# Patient Record
Sex: Male | Born: 1970 | Race: White | Hispanic: No | Smoking: Never smoker
Health system: Southern US, Community
[De-identification: ages and names within clinical notes are randomized; demographics above are authoritative.]

## PROBLEM LIST (undated history)

## (undated) DIAGNOSIS — Z8669 Personal history of other diseases of the nervous system and sense organs: Secondary | ICD-10-CM

## (undated) DIAGNOSIS — R972 Elevated prostate specific antigen [PSA]: Secondary | ICD-10-CM

## (undated) DIAGNOSIS — N529 Male erectile dysfunction, unspecified: Secondary | ICD-10-CM

## (undated) DIAGNOSIS — A630 Anogenital (venereal) warts: Secondary | ICD-10-CM

## (undated) DIAGNOSIS — Z8719 Personal history of other diseases of the digestive system: Secondary | ICD-10-CM

## (undated) HISTORY — DX: Personal history of other diseases of the nervous system and sense organs: Z86.69

## (undated) HISTORY — DX: Elevated prostate specific antigen (PSA): R97.20

## (undated) HISTORY — PX: COLONOSCOPY: SHX174

## (undated) HISTORY — PX: TONSILLECTOMY: SHX5217

## (undated) HISTORY — PX: WISDOM TOOTH EXTRACTION: SHX21

## (undated) HISTORY — DX: Personal history of other diseases of the digestive system: Z87.19

## (undated) HISTORY — DX: Male erectile dysfunction, unspecified: N52.9

## (undated) HISTORY — DX: Anogenital (venereal) warts: A63.0

---
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
## 2012-03-23 ENCOUNTER — Ambulatory Visit (INDEPENDENT_AMBULATORY_CARE_PROVIDER_SITE_OTHER): Payer: BC Managed Care – PPO | Admitting: Family Medicine

## 2012-03-23 ENCOUNTER — Encounter: Payer: Self-pay | Admitting: Family Medicine

## 2012-03-23 VITALS — BP 120/78 | HR 77 | Ht 73.0 in | Wt 192.0 lb

## 2012-03-23 DIAGNOSIS — Z7689 Persons encountering health services in other specified circumstances: Secondary | ICD-10-CM

## 2012-03-23 DIAGNOSIS — Z7189 Other specified counseling: Secondary | ICD-10-CM

## 2012-03-23 DIAGNOSIS — G43009 Migraine without aura, not intractable, without status migrainosus: Secondary | ICD-10-CM

## 2012-03-23 DIAGNOSIS — K625 Hemorrhage of anus and rectum: Secondary | ICD-10-CM

## 2012-03-23 NOTE — Assessment & Plan Note (Signed)
Reassured pt that this sounds like either hemorrhoidal or fissure bleeding, happens rarely.   Reassured pt. Signs/symptoms to call or return for were reviewed and pt expressed understanding.

## 2012-03-23 NOTE — Assessment & Plan Note (Signed)
Infrequent. OTC abortive med works consistently. He knows his triggers to avoid.

## 2012-03-23 NOTE — Assessment & Plan Note (Signed)
No old records to obtain.

## 2012-03-23 NOTE — Progress Notes (Signed)
Office Note 03/23/2012  CC:  Chief Complaint  Patient presents with  . Establish Care    HPI:  Jack Kelley. is a 42 y.o. White male who is here to establish care. Patient's most recent primary MD: Dr. Duffy Rhody Wilson--retired long ago, ?>15 yrs?. Old records were not reviewed prior to or during today's visit.  We reviewed hx today, answered a few general questions about his migraines and rectal bleeding listed below, and generally decided to set up a time for CPE w/DRE and PSA + routine fasting health panel. No acute complaints today.  Past Medical History  Diagnosis Date  . History of migraine headaches     about 1/month--advil migraine helps abort them  . History of rectal bleeding     Rare BRB on toilet tissue; usually after a hard stool.  Only occaasionally  . Genital warts     x 1 episode in college--treated and have never recurred.    Past Surgical History  Procedure Laterality Date  . Appendectomy  1986  . Chest tube insertion  1989    pneumothorax  . Tonsillectomy  1970s    Family History  Problem Relation Age of Onset  . Hypertension Mother   . Cancer Father     prostate  . Diabetes Paternal Grandfather     History   Social History  . Marital Status: Married    Spouse Name: N/A    Number of Children: N/A  . Years of Education: N/A   Occupational History  . Not on file.   Social History Main Topics  . Smoking status: Never Smoker   . Smokeless tobacco: Never Used  . Alcohol Use: No  . Drug Use: No  . Sexually Active: Not on file   Other Topics Concern  . Not on file   Social History Narrative   Married, 2 children--lives in [place].   College: [employer]   Occupation: Emergency planning/management officer for [employer].   No tob/alc, only occasional alcohol.   Exercises in warm months: soccer, basketball.   Regular american diet.    No outpatient encounter prescriptions on file as of 03/23/2012.   No facility-administered encounter medications on file as  of 03/23/2012.    No Known Allergies  ROS Review of Systems  Constitutional: Negative for fever and fatigue.  HENT: Negative for congestion and sore throat.   Eyes: Negative for visual disturbance.  Respiratory: Negative for cough.   Cardiovascular: Negative for chest pain.  Gastrointestinal: Negative for nausea and abdominal pain.  Genitourinary: Negative for dysuria.  Musculoskeletal: Negative for back pain and joint swelling.  Skin: Negative for rash.  Neurological: Negative for weakness and headaches.  Hematological: Negative for adenopathy.    PE; Blood pressure 120/78, pulse 77, height 6\' 1"  (1.854 m), weight 192 lb (87.091 kg), SpO2 97.00%. Gen: Alert, well appearing.  Patient is oriented to person, place, time, and situation. ENT: Eyes: no injection, icteris, swelling, or exudate.  EOMI, PERRLA.  Mouth: lips without lesion/swelling.  Oral mucosa pink and moist.  Dentition intact and without obvious caries or gingival swelling.  Oropharynx without erythema, exudate, or swelling. Neck - No masses or thyromegaly or limitation in range of motion CV: RRR, no m/r/g.   LUNGS: CTA bilat, nonlabored resps, good aeration in all lung fields. ABD: soft, ND/NT EXT: no clubbing, cyanosis, or edema.   Pertinent labs:  None today  ASSESSMENT AND PLAN:   New pt: no old records to obtain.  Migraine headache without  aura Infrequent. OTC abortive med works consistently. He knows his triggers to avoid.  Rectal bleeding Reassured pt that this sounds like either hemorrhoidal or fissure bleeding, happens rarely.   Reassured pt. Signs/symptoms to call or return for were reviewed and pt expressed understanding.   Encounter to establish care with new doctor No old records to obtain.     An After Visit Summary was printed and given to the patient.  Return for CPE at pt's convenience (fasting/morning appt OR fasting labs +PSA the week prior).

## 2012-03-30 ENCOUNTER — Ambulatory Visit: Payer: BC Managed Care – PPO

## 2012-03-30 ENCOUNTER — Other Ambulatory Visit (INDEPENDENT_AMBULATORY_CARE_PROVIDER_SITE_OTHER): Payer: BC Managed Care – PPO

## 2012-03-30 DIAGNOSIS — Z0000 Encounter for general adult medical examination without abnormal findings: Secondary | ICD-10-CM

## 2012-03-30 DIAGNOSIS — Z125 Encounter for screening for malignant neoplasm of prostate: Secondary | ICD-10-CM

## 2012-03-30 LAB — CBC WITH DIFFERENTIAL/PLATELET
Basophils Absolute: 0 10*3/uL (ref 0.0–0.1)
Basophils Relative: 0 % (ref 0–1)
HCT: 42.1 % (ref 39.0–52.0)
Hemoglobin: 14.9 g/dL (ref 13.0–17.0)
Lymphocytes Relative: 38 % (ref 12–46)
Monocytes Absolute: 0.6 10*3/uL (ref 0.1–1.0)
Neutro Abs: 2.5 10*3/uL (ref 1.7–7.7)
Neutrophils Relative %: 48 % (ref 43–77)
RDW: 13.4 % (ref 11.5–15.5)
WBC: 5.2 10*3/uL (ref 4.0–10.5)

## 2012-03-30 LAB — BASIC METABOLIC PANEL
BUN: 16 mg/dL (ref 6–23)
Chloride: 104 mEq/L (ref 96–112)
Glucose, Bld: 79 mg/dL (ref 70–99)
Potassium: 4.3 mEq/L (ref 3.5–5.1)

## 2012-03-30 LAB — LIPID PANEL
HDL: 38.1 mg/dL — ABNORMAL LOW (ref >39.00)
Triglycerides: 108 mg/dL (ref 0.0–149.0)

## 2012-03-30 NOTE — Progress Notes (Signed)
Labs only

## 2012-04-06 ENCOUNTER — Ambulatory Visit (INDEPENDENT_AMBULATORY_CARE_PROVIDER_SITE_OTHER): Payer: BC Managed Care – PPO | Admitting: Family Medicine

## 2012-04-06 VITALS — BP 115/80 | HR 71 | Temp 98.4°F | Resp 14 | Ht 74.0 in | Wt 193.5 lb

## 2012-04-06 DIAGNOSIS — Z0000 Encounter for general adult medical examination without abnormal findings: Secondary | ICD-10-CM

## 2012-04-06 NOTE — Progress Notes (Signed)
Office Note 04/08/2012  CC:  Chief Complaint  Patient presents with  . Annual Exam    HPI:  Jack Kelley. is a 42 y.o. White male who is here for CPE. We reviewed his labs done last week: all normal, including PSA (+FH of prostate cancer). He has had no further BRBPR. Feels well and has no acute complaints.  Past Medical History  Diagnosis Date  . History of migraine headaches     about 1/month--advil migraine helps abort them  . History of rectal bleeding     Rare BRB on toilet tissue; usually after a hard stool.  Only occaasionally  . Genital warts     x 1 episode in college--treated and have never recurred.    Past Surgical History  Procedure Laterality Date  . Appendectomy  1986  . Chest tube insertion  1989    pneumothorax  . Tonsillectomy  1970s    Family History  Problem Relation Age of Onset  . Hypertension Mother   . Cancer Father     prostate  . Diabetes Paternal Grandfather     History   Social History  . Marital Status: Married    Spouse Name: N/A    Number of Children: N/A  . Years of Education: N/A   Occupational History  . Not on file.   Social History Main Topics  . Smoking status: Never Smoker   . Smokeless tobacco: Never Used  . Alcohol Use: No  . Drug Use: No  . Sexually Active: Not on file   Other Topics Concern  . Not on file   Social History Narrative   Married, 2 children--lives in [place].   College: [employer]   Occupation: Emergency planning/management officer for [employer].   No tob/alc, only occasional alcohol.   Exercises in warm months: soccer, basketball.   Regular american diet.    No outpatient prescriptions prior to visit.   No facility-administered medications prior to visit.    No Known Allergies  ROS Review of Systems  Constitutional: Negative for fever, chills, appetite change and fatigue.  HENT: Negative for ear pain, congestion, sore throat, neck stiffness and dental problem.   Eyes: Negative for discharge, redness  and visual disturbance.  Respiratory: Negative for cough, chest tightness, shortness of breath and wheezing.   Cardiovascular: Negative for chest pain, palpitations and leg swelling.  Gastrointestinal: Negative for nausea, vomiting, abdominal pain, diarrhea and blood in stool.  Genitourinary: Negative for dysuria, urgency, frequency, hematuria, flank pain and difficulty urinating.  Musculoskeletal: Negative for myalgias, back pain, joint swelling and arthralgias.  Skin: Negative for pallor and rash.  Neurological: Negative for dizziness, speech difficulty, weakness and headaches.  Hematological: Negative for adenopathy. Does not bruise/bleed easily.  Psychiatric/Behavioral: Negative for confusion and sleep disturbance. The patient is not nervous/anxious.     PE; Blood pressure 115/80, pulse 71, temperature 98.4 F (36.9 C), temperature source Temporal, resp. rate 14, height 6\' 2"  (1.88 m), weight 193 lb 8 oz (87.771 kg), SpO2 99.00%. Gen: Alert, well appearing.  Patient is oriented to person, place, time, and situation. AFFECT: pleasant, lucid thought and speech. ENT: Ears: EACs clear, normal epithelium.  TMs with good light reflex and landmarks bilaterally.  Eyes: no injection, icteris, swelling, or exudate.  EOMI, PERRLA. Nose: no drainage or turbinate edema/swelling.  No injection or focal lesion.  Mouth: lips without lesion/swelling.  Oral mucosa pink and moist.  Dentition intact and without obvious caries or gingival swelling.  Oropharynx without erythema,  exudate, or swelling.  Neck: supple/nontender.  No LAD, mass, or TM.  Carotid pulses 2+ bilaterally, without bruits. CV: RRR, no m/r/g.   LUNGS: CTA bilat, nonlabored resps, good aeration in all lung fields. ABD: soft, NT, ND, BS normal.  No hepatospenomegaly or mass.  No bruits. EXT: no clubbing, cyanosis, or edema.  Musculoskeletal: no joint swelling, erythema, warmth, or tenderness.  ROM of all joints intact. Skin - no sores or  suspicious lesions or rashes or color changes Genitals normal; both testes normal without tenderness, masses, hydroceles, varicoceles, erythema or swelling. Shaft normal, circumcised, meatus normal without discharge. No inguinal hernia noted. No inguinal lymphadenopathy. Rectal exam: negative without mass, lesions or tenderness, PROSTATE EXAM: smooth and symmetric without nodules or tenderness.   Pertinent labs:  Lab Results  Component Value Date   TSH 1.17 03/30/2012   Lab Results  Component Value Date   WBC 5.2 03/30/2012   HGB 14.9 03/30/2012   HCT 42.1 03/30/2012   MCV 84.2 03/30/2012   PLT 205 03/30/2012   Lab Results  Component Value Date   CREATININE 0.9 03/30/2012   BUN 16 03/30/2012   NA 137 03/30/2012   K 4.3 03/30/2012   CL 104 03/30/2012   CO2 27 03/30/2012   No results found for this basename: ALT,  AST,  GGT,  ALKPHOS,  BILITOT   Lab Results  Component Value Date   CHOL 167 03/30/2012   Lab Results  Component Value Date   HDL 38.10* 03/30/2012   Lab Results  Component Value Date   LDLCALC 107* 03/30/2012   Lab Results  Component Value Date   TRIG 108.0 03/30/2012   Lab Results  Component Value Date   CHOLHDL 4 03/30/2012   Lab Results  Component Value Date   PSA 1.78 03/30/2012    ASSESSMENT AND PLAN:   Health maintenance examination Reviewed age and gender appropriate health maintenance issues (prudent diet, regular exercise, health risks of tobacco and excessive alcohol, use of seatbelts, fire alarms in home, use of sunscreen).  Also reviewed age and gender appropriate health screening as well as vaccine recommendations. An After Visit Summary was printed and given to the patient.    FOLLOW UP:  Return in about 1 year (around 04/06/2013) for CPE.

## 2012-04-06 NOTE — Patient Instructions (Signed)
Health Maintenance, Males A healthy lifestyle and preventative care can promote health and wellness.  Maintain regular health, dental, and eye exams.  Eat a healthy diet. Foods like vegetables, fruits, whole grains, low-fat dairy products, and lean protein foods contain the nutrients you need without too many calories. Decrease your intake of foods high in solid fats, added sugars, and salt. Get information about a proper diet from your caregiver, if necessary.  Regular physical exercise is one of the most important things you can do for your health. Most adults should get at least 150 minutes of moderate-intensity exercise (any activity that increases your heart rate and causes you to sweat) each week. In addition, most adults need muscle-strengthening exercises on 2 or more days a week.   Maintain a healthy weight. The body mass index (BMI) is a screening tool to identify possible weight problems. It provides an estimate of body fat based on height and weight. Your caregiver can help determine your BMI, and can help you achieve or maintain a healthy weight. For adults 20 years and older:  A BMI below 18.5 is considered underweight.  A BMI of 18.5 to 24.9 is normal.  A BMI of 25 to 29.9 is considered overweight.  A BMI of 30 and above is considered obese.  Maintain normal blood lipids and cholesterol by exercising and minimizing your intake of saturated fat. Eat a balanced diet with plenty of fruits and vegetables. Blood tests for lipids and cholesterol should begin at age 20 and be repeated every 5 years. If your lipid or cholesterol levels are high, you are over 50, or you are a high risk for heart disease, you may need your cholesterol levels checked more frequently.Ongoing high lipid and cholesterol levels should be treated with medicines, if diet and exercise are not effective.  If you smoke, find out from your caregiver how to quit. If you do not use tobacco, do not start.  If you  choose to drink alcohol, do not exceed 2 drinks per day. One drink is considered to be 12 ounces (355 mL) of beer, 5 ounces (148 mL) of wine, or 1.5 ounces (44 mL) of liquor.  Avoid use of street drugs. Do not share needles with anyone. Ask for help if you need support or instructions about stopping the use of drugs.  High blood pressure causes heart disease and increases the risk of stroke. Blood pressure should be checked at least every 1 to 2 years. Ongoing high blood pressure should be treated with medicines if weight loss and exercise are not effective.  If you are 45 to 43 years old, ask your caregiver if you should take aspirin to prevent heart disease.  Diabetes screening involves taking a blood sample to check your fasting blood sugar level. This should be done once every 3 years, after age 45, if you are within normal weight and without risk factors for diabetes. Testing should be considered at a younger age or be carried out more frequently if you are overweight and have at least 1 risk factor for diabetes.  Colorectal cancer can be detected and often prevented. Most routine colorectal cancer screening begins at the age of 50 and continues through age 75. However, your caregiver may recommend screening at an earlier age if you have risk factors for colon cancer. On a yearly basis, your caregiver may provide home test kits to check for hidden blood in the stool. Use of a small camera at the end of a tube,   to directly examine the colon (sigmoidoscopy or colonoscopy), can detect the earliest forms of colorectal cancer. Talk to your caregiver about this at age 50, when routine screening begins. Direct examination of the colon should be repeated every 5 to 10 years through age 75, unless early forms of pre-cancerous polyps or small growths are found.  Hepatitis C blood testing is recommended for all people born from 1945 through 1965 and any individual with known risks for hepatitis C.  Healthy  men should no longer receive prostate-specific antigen (PSA) blood tests as part of routine cancer screening. Consult with your caregiver about prostate cancer screening.  Testicular cancer screening is not recommended for adolescents or adult males who have no symptoms. Screening includes self-exam, caregiver exam, and other screening tests. Consult with your caregiver about any symptoms you have or any concerns you have about testicular cancer.  Practice safe sex. Use condoms and avoid high-risk sexual practices to reduce the spread of sexually transmitted infections (STIs).  Use sunscreen with a sun protection factor (SPF) of 30 or greater. Apply sunscreen liberally and repeatedly throughout the day. You should seek shade when your shadow is shorter than you. Protect yourself by wearing long sleeves, pants, a wide-brimmed hat, and sunglasses year round, whenever you are outdoors.  Notify your caregiver of new moles or changes in moles, especially if there is a change in shape or color. Also notify your caregiver if a mole is larger than the size of a pencil eraser.  A one-time screening for abdominal aortic aneurysm (AAA) and surgical repair of large AAAs by sound wave imaging (ultrasonography) is recommended for ages 65 to 75 years who are current or former smokers.  Stay current with your immunizations. Document Released: 06/25/2007 Document Revised: 03/21/2011 Document Reviewed: 05/24/2010 Exit[employer] Patient Information 2013 [employer].  

## 2012-04-08 ENCOUNTER — Encounter: Payer: Self-pay | Admitting: Family Medicine

## 2012-04-08 NOTE — Assessment & Plan Note (Signed)
Reviewed age and gender appropriate health maintenance issues (prudent diet, regular exercise, health risks of tobacco and excessive alcohol, use of seatbelts, fire alarms in home, use of sunscreen).  Also reviewed age and gender appropriate health screening as well as vaccine recommendations. An After Visit Summary was printed and given to the patient.

## 2013-04-02 ENCOUNTER — Ambulatory Visit (INDEPENDENT_AMBULATORY_CARE_PROVIDER_SITE_OTHER): Payer: BC Managed Care – PPO | Admitting: Family Medicine

## 2013-04-02 ENCOUNTER — Ambulatory Visit (HOSPITAL_BASED_OUTPATIENT_CLINIC_OR_DEPARTMENT_OTHER)
Admission: RE | Admit: 2013-04-02 | Discharge: 2013-04-02 | Disposition: A | Discharge status: Home / Self Care | Payer: BC Managed Care – PPO | Source: Ambulatory Visit | Attending: Family Medicine | Admitting: Family Medicine

## 2013-04-02 ENCOUNTER — Encounter: Payer: Self-pay | Admitting: Family Medicine

## 2013-04-02 VITALS — BP 121/84 | HR 76 | Temp 98.2°F | Resp 16 | Ht 74.0 in | Wt 198.0 lb

## 2013-04-02 DIAGNOSIS — M549 Dorsalgia, unspecified: Secondary | ICD-10-CM

## 2013-04-02 DIAGNOSIS — M47814 Spondylosis without myelopathy or radiculopathy, thoracic region: Secondary | ICD-10-CM | POA: Insufficient documentation

## 2013-04-02 NOTE — Progress Notes (Signed)
Pre visit review using our clinic review tool, if applicable. No additional management support is needed unless otherwise documented below in the visit note. 

## 2013-04-02 NOTE — Progress Notes (Signed)
OFFICE NOTE  04/02/2013  CC:  Chief Complaint  Patient presents with  . Back Pain    Sharp pain during exertion x 1 week     HPI: Patient is a 43 y.o. Caucasian male who is here for pain in back. About 7d duration and not getting better.  Onset was 1-2 days after jumping on trampoline and then running 2 miles. Describes sharp pains lateral to spine on both sides in mid back level--but the pain only occurs if he is running or jumping (and sometimes when he bends certain ways).  No midline pain, no radiation of the pain. No SOB, no cough, no chest pains.  He tried ibuprofen x 1 dose and admits it felt better after.  No heat applied.  No hx of back pain problems in the past.  Pertinent PMH:  Past medical, surgical, social, and family history reviewed and no changes are noted since last office visit.  MEDS:  NONE  PE: Blood pressure 121/84, pulse 76, temperature 98.2 F (36.8 C), temperature source Temporal, resp. rate 16, height 6\' 2"  (1.88 m), weight 198 lb (89.812 kg), SpO2 98.00%. Gen: Alert, well appearing.  Patient is oriented to person, place, time, and situation. CV: RRR, no m/r/g.   LUNGS: CTA bilat, nonlabored resps, good aeration in all lung fields. Back: no deformity, no tenderness.  ROM is fullly intact.    IMPRESSION AND PLAN:  Musculoskeletal mid back pain. Given his recent trampoline use I want to check a T-spine plain film (r/o pars defect). Recommended ibuprofen 600 mg bid x 10d, apply heat regularly, do gentle stretching.  An After Visit Summary was printed and given to the patient.  FOLLOW UP:  Prn (make appt for fasting CPE at his convenience)

## 2014-05-15 ENCOUNTER — Telehealth: Payer: Self-pay | Admitting: *Deleted

## 2014-05-15 ENCOUNTER — Encounter: Payer: Self-pay | Admitting: Family Medicine

## 2014-05-15 ENCOUNTER — Ambulatory Visit (INDEPENDENT_AMBULATORY_CARE_PROVIDER_SITE_OTHER): Payer: BLUE CROSS/BLUE SHIELD | Admitting: Family Medicine

## 2014-05-15 VITALS — BP 127/84 | HR 63 | Temp 98.4°F | Resp 16 | Ht 73.75 in | Wt 201.0 lb

## 2014-05-15 DIAGNOSIS — Z23 Encounter for immunization: Secondary | ICD-10-CM | POA: Diagnosis not present

## 2014-05-15 DIAGNOSIS — Z0000 Encounter for general adult medical examination without abnormal findings: Secondary | ICD-10-CM

## 2014-05-15 DIAGNOSIS — Z8042 Family history of malignant neoplasm of prostate: Secondary | ICD-10-CM | POA: Diagnosis not present

## 2014-05-15 DIAGNOSIS — N5201 Erectile dysfunction due to arterial insufficiency: Secondary | ICD-10-CM

## 2014-05-15 MED ORDER — SILDENAFIL CITRATE 100 MG PO TABS: ORAL_TABLET | ORAL | Status: DC

## 2014-05-15 MED ORDER — SILDENAFIL CITRATE 20 MG PO TABS: ORAL_TABLET | ORAL | Status: DC

## 2014-05-15 NOTE — Telephone Encounter (Signed)
Pt LMOM stating that his insurance will not cover the Viagra 100mg , he is requesting PA. I think if we send in new Rx for (generic) sildenafil 20mg  2-5 tab daily 30 min prior to intercourse the insurance will cover most if not all. Please advised. Needs to be sent to [facility].

## 2014-05-15 NOTE — Progress Notes (Signed)
Pre visit review using our clinic review tool, if applicable. No additional management support is needed unless otherwise documented below in the visit note. 

## 2014-05-15 NOTE — Telephone Encounter (Signed)
Pt advised and voiced understanding.   

## 2014-05-15 NOTE — Progress Notes (Signed)
Office Note 05/15/2014  CC:  Chief Complaint  Patient presents with  . Annual Exam    HPI:  Jack Kelley. is a 44 y.o. White male who is here for annual health maintenance exam-he is NOT fasting today.  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  Initially no psych componenet or pressure to perform but now that it has gone on this long he does feel some pressure/guilt. No hx of significant penile curvature.   No penile pain with erection.   Past Medical History  Diagnosis Date  . History of migraine headaches     about 1/month--advil migraine helps abort them  . History of rectal bleeding     Rare BRB on toilet tissue; usually after a hard stool.  Only occaasionally  . Genital warts     x 1 episode in college--treated and have never recurred.    Past Surgical History  Procedure Laterality Date  . Appendectomy  1986  . Chest tube insertion  1989    pneumothorax  . Tonsillectomy  1970s    Family History  Problem Relation Age of Onset  . Hypertension Mother   . Cancer Father     prostate  . Diabetes Paternal Grandfather     History   Social History  . Marital Status: Married    Spouse Name: N/A  . Number of Children: N/A  . Years of Education: N/A   Occupational History  . Not on file.   Social History Main Topics  . Smoking status: Never Smoker   . Smokeless tobacco: Never Used  . Alcohol Use: Yes     Comment: occasionally  . Drug Use: No  . Sexual Activity: Not on file   Other Topics Concern  . Not on file   Social History Narrative   Married, 2 children--lives in [place].   College: [employer]   Occupation: Government social research officer for [employer].   No tob/alc, only occasional alcohol.   Exercises in warm months: soccer, basketball.   Regular american diet.   MEDS: none  No Known Allergies  ROS Review of Systems  Constitutional: Negative  for fever, chills, appetite change and fatigue.  HENT: Negative for congestion, dental problem, ear pain and sore throat.   Eyes: Negative for discharge, redness and visual disturbance.  Respiratory: Negative for cough, chest tightness, shortness of breath and wheezing.   Cardiovascular: Negative for chest pain, palpitations and leg swelling.  Gastrointestinal: Negative for nausea, vomiting, abdominal pain, diarrhea and blood in stool.  Genitourinary: Negative for dysuria, urgency, frequency, hematuria, flank pain and difficulty urinating.  Musculoskeletal: Negative for myalgias, back pain, joint swelling, arthralgias and neck stiffness.  Skin: Negative for pallor and rash.  Neurological: Negative for dizziness, speech difficulty, weakness and headaches.  Hematological: Negative for adenopathy. Does not bruise/bleed easily.  Psychiatric/Behavioral: Negative for confusion and sleep disturbance. The patient is not nervous/anxious.     PE; Blood pressure 127/84, pulse 63, temperature 98.4 F (36.9 C), temperature source Temporal, resp. rate 16, height 6' 1.75" (1.873 m), weight 201 lb (91.173 kg), SpO2 98 %. Gen: Alert, well appearing.  Patient is oriented to person, place, time, and situation. AFFECT: pleasant, lucid thought and speech. ENT: Ears: EACs clear, normal epithelium.  TMs with good light reflex and landmarks bilaterally.  Eyes: no injection, icteris, swelling, or exudate.  EOMI, PERRLA. Nose: no drainage or turbinate edema/swelling.  No injection  or focal lesion.  Mouth: lips without lesion/swelling.  Oral mucosa pink and moist.  Dentition intact and without obvious caries or gingival swelling.  Oropharynx without erythema, exudate, or swelling.  Neck: supple/nontender.  No LAD, mass, or TM.  Carotid pulses 2+ bilaterally, without bruits. CV: RRR, no m/r/g.   LUNGS: CTA bilat, nonlabored resps, good aeration in all lung fields. ABD: soft, NT, ND, BS normal.  No hepatospenomegaly or  mass.  No bruits. EXT: no clubbing, cyanosis, or edema.  Musculoskeletal: no joint swelling, erythema, warmth, or tenderness.  ROM of all joints intact. Skin - no sores or suspicious lesions or rashes or color changes. Low back with football shaped dark brown macule 2 cm L to R length, 1.5 cm top to bottom.  No border or signif pigment irregularity.  Pt states this lesion has been present many years and is unchanged.  He has many pigmented and nonpigmented skin lesions that appear benign scattered over his body. Rectal exam: negative without mass, lesions or tenderness, PROSTATE EXAM: smooth and symmetric without nodules or tenderness.   Pertinent labs:  Lab Results  Component Value Date   TSH 1.17 03/30/2012   Lab Results  Component Value Date   WBC 5.2 03/30/2012   HGB 14.9 03/30/2012   HCT 42.1 03/30/2012   MCV 84.2 03/30/2012   PLT 205 03/30/2012   Lab Results  Component Value Date   CREATININE 0.9 03/30/2012   BUN 16 03/30/2012   NA 137 03/30/2012   K 4.3 03/30/2012   CL 104 03/30/2012   CO2 27 03/30/2012   No results found for: ALT, AST, GGT, ALKPHOS, BILITOT Lab Results  Component Value Date   CHOL 167 03/30/2012   Lab Results  Component Value Date   HDL 38.10* 03/30/2012   Lab Results  Component Value Date   LDLCALC 107* 03/30/2012   Lab Results  Component Value Date   TRIG 108.0 03/30/2012   Lab Results  Component Value Date   CHOLHDL 4 03/30/2012   Lab Results  Component Value Date   PSA 1.78 03/30/2012    ASSESSMENT AND PLAN:   Health maintenance exam:  Reviewed age and gender appropriate health maintenance issues (prudent diet, regular exercise, health risks of tobacco and excessive alcohol, use of seatbelts, fire alarms in home, use of sunscreen).  Also reviewed age and gender appropriate health screening as well as vaccine recommendations. Tdap today. DRE normal today (pt's father dx'd with prostate ca at approx age 30). Return when fasting  for lab visit: health panel and PSA.  Recommend annual complete skin exam with dermatologist: gave pt info on two local dermatologist's so he can look into this, call if referral needed.  FOLLOW UP:  Return in about 1 year (around 05/15/2015) for annual CPE (fasting): pt needs lab visit at earliest convenience for fasting labs (ordered).

## 2014-05-15 NOTE — Patient Instructions (Signed)
Dr. Denna Haggard at [facility]) [phone]).  Dr. Nena Polio [facility]) [phone]).

## 2014-05-15 NOTE — Telephone Encounter (Signed)
OK.  Viagra 20mg  pills sent to [facility].

## 2014-05-16 ENCOUNTER — Other Ambulatory Visit (INDEPENDENT_AMBULATORY_CARE_PROVIDER_SITE_OTHER): Payer: BLUE CROSS/BLUE SHIELD

## 2014-05-16 DIAGNOSIS — Z0000 Encounter for general adult medical examination without abnormal findings: Secondary | ICD-10-CM

## 2014-05-16 DIAGNOSIS — Z8042 Family history of malignant neoplasm of prostate: Secondary | ICD-10-CM

## 2014-05-16 LAB — CBC WITH DIFFERENTIAL/PLATELET
BASOS PCT: 0.7 % (ref 0.0–3.0)
Basophils Absolute: 0 10*3/uL (ref 0.0–0.1)
Eosinophils Absolute: 0.1 10*3/uL (ref 0.0–0.7)
Eosinophils Relative: 2.3 % (ref 0.0–5.0)
HCT: 44.3 % (ref 39.0–52.0)
Hemoglobin: 15.1 g/dL (ref 13.0–17.0)
Lymphocytes Relative: 31.7 % (ref 12.0–46.0)
Lymphs Abs: 1.7 10*3/uL (ref 0.7–4.0)
MCHC: 34 g/dL (ref 30.0–36.0)
MCV: 87.6 fl (ref 78.0–100.0)
MONO ABS: 0.5 10*3/uL (ref 0.1–1.0)
Monocytes Relative: 8.5 % (ref 3.0–12.0)
NEUTROS PCT: 56.8 % (ref 43.0–77.0)
Neutro Abs: 3.1 10*3/uL (ref 1.4–7.7)
PLATELETS: 219 10*3/uL (ref 150.0–400.0)
RBC: 5.05 Mil/uL (ref 4.22–5.81)
RDW: 13.2 % (ref 11.5–15.5)
WBC: 5.4 10*3/uL (ref 4.0–10.5)

## 2014-05-16 LAB — LIPID PANEL
CHOL/HDL RATIO: 4
Cholesterol: 179 mg/dL (ref 0–200)
HDL: 48.6 mg/dL (ref >39.00)
LDL Cholesterol: 110 mg/dL — ABNORMAL HIGH (ref 0–99)
NONHDL: 130.4
TRIGLYCERIDES: 101 mg/dL (ref 0.0–149.0)
VLDL: 20.2 mg/dL (ref 0.0–40.0)

## 2014-05-16 LAB — COMPREHENSIVE METABOLIC PANEL
ALK PHOS: 66 U/L (ref 39–117)
ALT: 15 U/L (ref 0–53)
AST: 17 U/L (ref 0–37)
Albumin: 4.2 g/dL (ref 3.5–5.2)
BILIRUBIN TOTAL: 1 mg/dL (ref 0.2–1.2)
BUN: 13 mg/dL (ref 6–23)
CO2: 29 mEq/L (ref 19–32)
CREATININE: 0.96 mg/dL (ref 0.40–1.50)
Calcium: 9.8 mg/dL (ref 8.4–10.5)
Chloride: 104 mEq/L (ref 96–112)
GFR: 90.21 mL/min (ref >60.00)
Glucose, Bld: 85 mg/dL (ref 70–99)
Potassium: 4.5 mEq/L (ref 3.5–5.1)
Sodium: 138 mEq/L (ref 135–145)
TOTAL PROTEIN: 7.5 g/dL (ref 6.0–8.3)

## 2014-05-16 LAB — PSA: PSA: 2.11 ng/mL (ref 0.10–4.00)

## 2014-05-16 LAB — TSH: TSH: 1.26 u[IU]/mL (ref 0.35–4.50)

## 2015-08-02 IMAGING — CR DG THORACIC SPINE 4+V
3 series · 3 of 3 positions shown · non-contrast
Comparison: None.

CLINICAL DATA: Back pain .

EXAM:
THORACIC SPINE - 4+ VIEW

[w t-spine a.p. *]
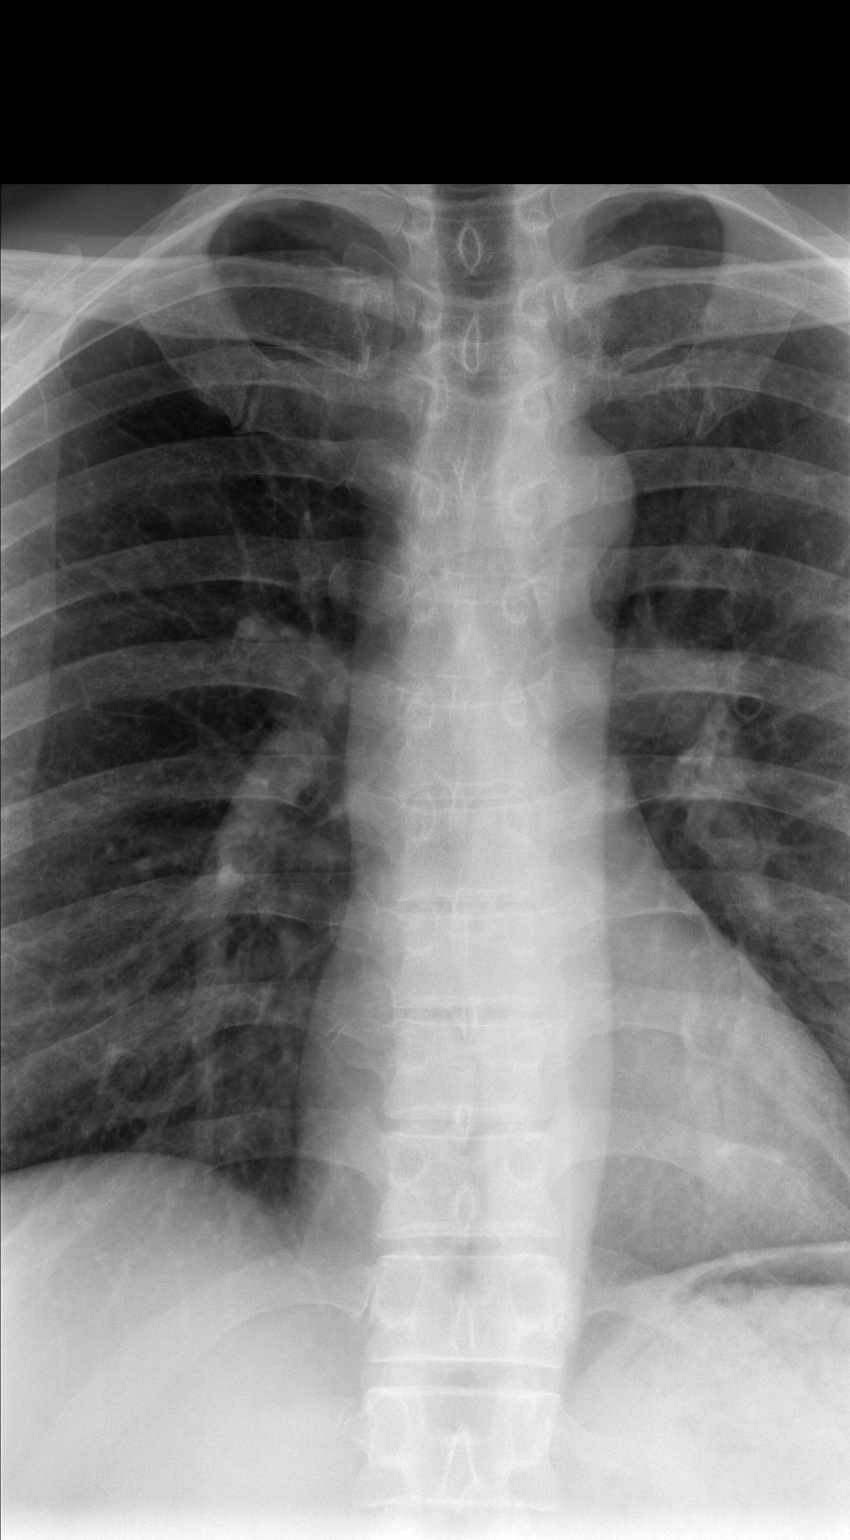

[w t-spine lat *]
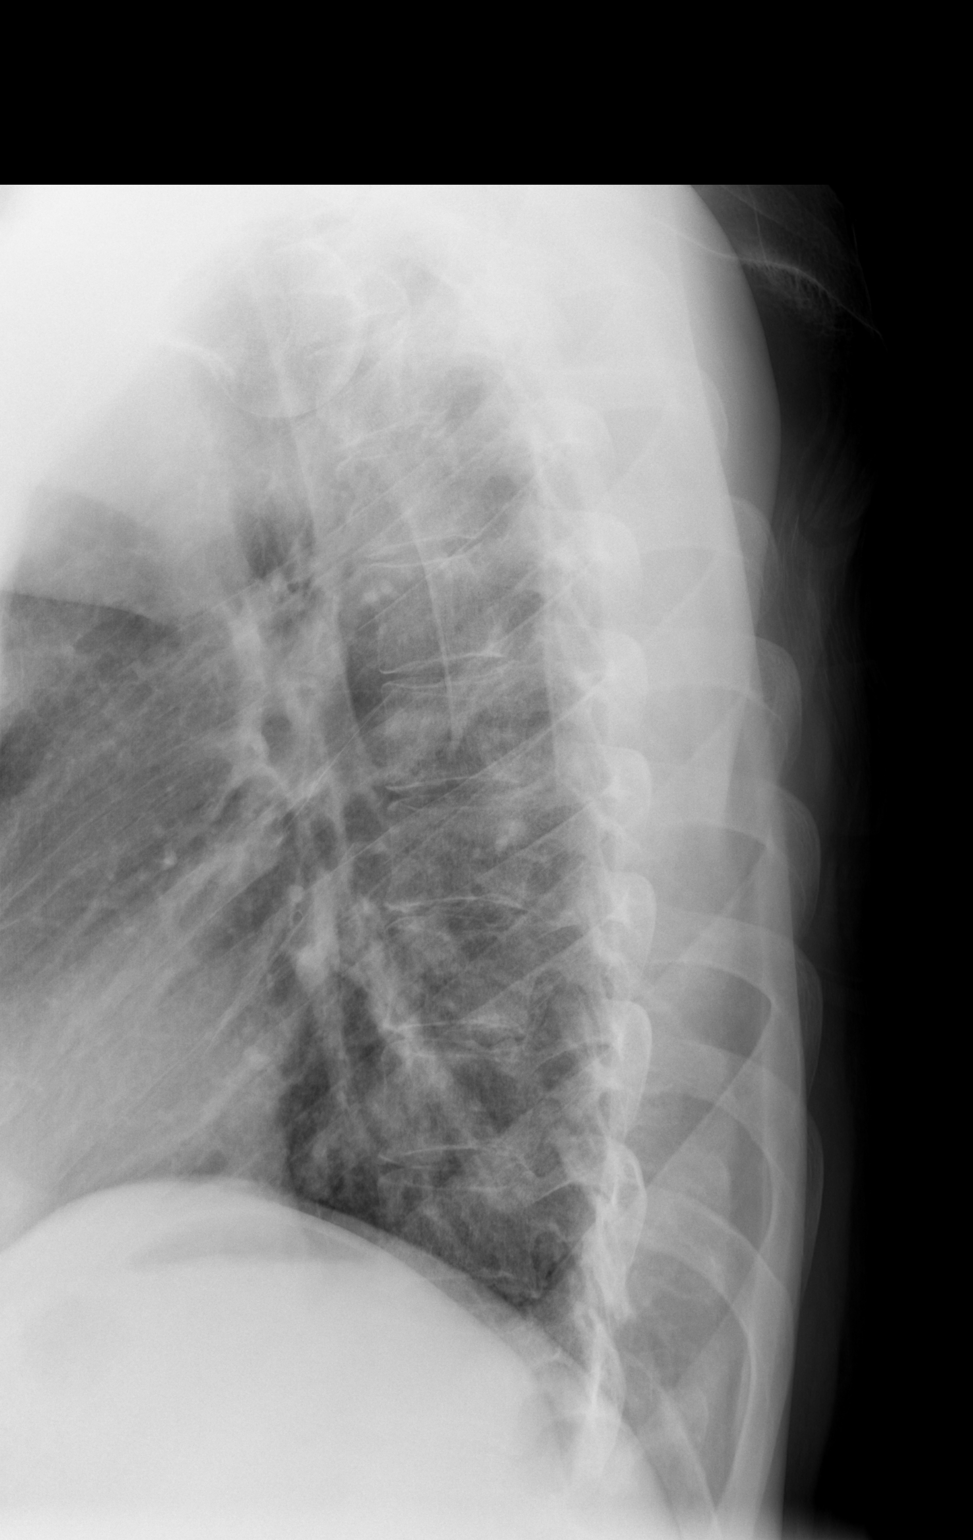

[w swimmers view]
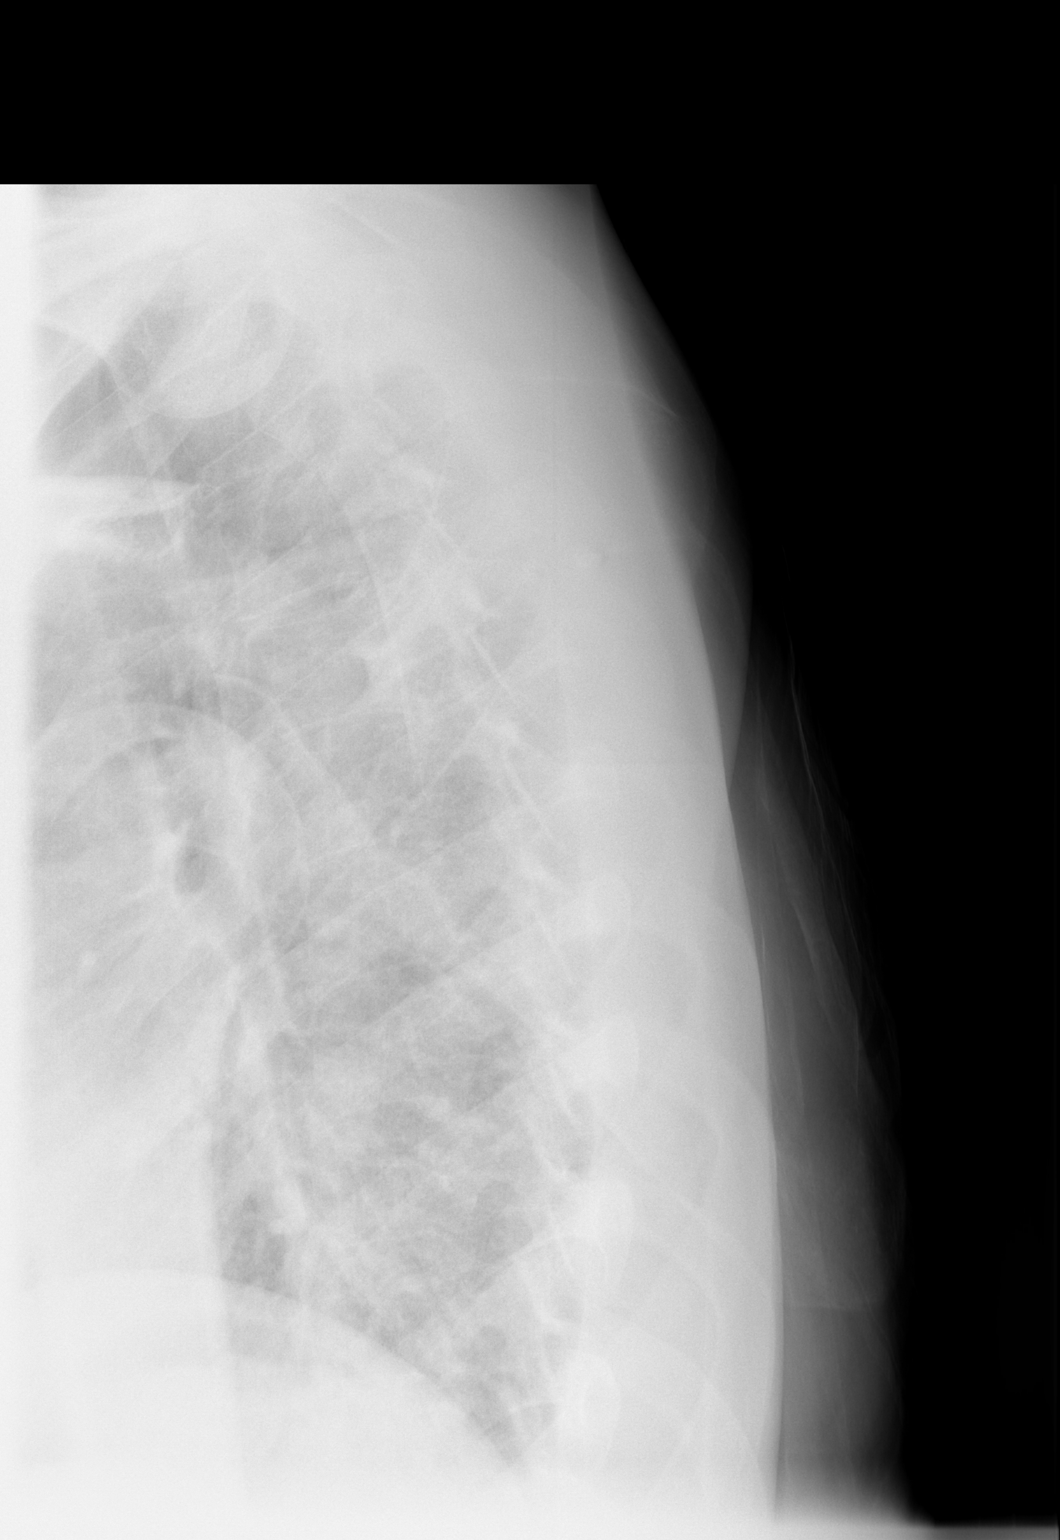

[3 of 3 positions shown; findings below may reference images not displayed]

FINDINGS: Diffuse degenerative changes thoracic spine. No acute abnormality
identified. Pedicles are intact. Paraspinal soft tissues are normal.
IMPRESSION: Diffuse degenerative change, no acute abnormality.

## 2016-02-22 ENCOUNTER — Ambulatory Visit (INDEPENDENT_AMBULATORY_CARE_PROVIDER_SITE_OTHER): Payer: BLUE CROSS/BLUE SHIELD | Admitting: Family Medicine

## 2016-02-22 ENCOUNTER — Encounter: Payer: Self-pay | Admitting: Family Medicine

## 2016-02-22 VITALS — BP 129/89 | HR 74 | Temp 97.9°F | Resp 16 | Ht 73.75 in | Wt 172.5 lb

## 2016-02-22 DIAGNOSIS — K4090 Unilateral inguinal hernia, without obstruction or gangrene, not specified as recurrent: Secondary | ICD-10-CM | POA: Diagnosis not present

## 2016-02-22 NOTE — Progress Notes (Signed)
Pre visit review using our clinic review tool, if applicable. No additional management support is needed unless otherwise documented below in the visit note. 

## 2016-02-22 NOTE — Progress Notes (Signed)
OFFICE VISIT  02/22/2016   CC:  Chief Complaint  Patient presents with  . Mass    Hernia? groin left side   HPI:    Patient is a 46 y.o.  male who presents for concern for possible hernia. Several nights ago he noted a sharp pain in L groin, saw a big bulge in the area.  The bulge has gone done since then, popped back out each night and went back in. When the hernia comes out he does have enough discomfort that he wants to lie down. Lying down consistently makes the pain go away and the "lump" retract.   Eating and drinking fine.  No nausea or vomiting. BMs normal.  No scrotal swelling.   Past Medical History:  Diagnosis Date  . Genital warts    x 1 episode in college--treated and have never recurred.  Marland Kitchen History of migraine headaches    about 1/month--advil migraine helps abort them  . History of rectal bleeding    Rare BRB on toilet tissue; usually after a hard stool.  Only occaasionally    Past Surgical History:  Procedure Laterality Date  . APPENDECTOMY  1986  . CHEST TUBE INSERTION  1989   pneumothorax  . TONSILLECTOMY  1970s    Outpatient Medications Prior to Visit  Medication Sig Dispense Refill  . sildenafil (REVATIO) 20 MG tablet 2-5 tabs po qd prn: take 30 min prior to intercourse 50 tablet 6   No facility-administered medications prior to visit.     No Known Allergies  ROS As per HPI  PE: Blood pressure 129/89, pulse 74, temperature 97.9 F (36.6 C), temperature source Oral, resp. rate 16, height 6' 1.75" (1.873 m), weight 172 lb 8 oz (78.2 kg), SpO2 97 %. Gen: Alert, well appearing.  Patient is oriented to person, place, time, and situation. AFFECT: pleasant, lucid thought and speech. Genitals normal; both testes normal without tenderness, masses, hydroceles, varicoceles, erythema or swelling. Shaft normal, circumcised, meatus normal without discharge. Inguinal hernia noted on the left, about the size of a golf ball.  Soft, NT.  When pt lies supine  the hernia retracts fully spontaneously w/out any palpation or manipulation. No inguinal lymphadenopathy.   LABS:    Chemistry      Component Value Date/Time   NA 138 05/16/2014 0827   K 4.5 05/16/2014 0827   CL 104 05/16/2014 0827   CO2 29 05/16/2014 0827   BUN 13 05/16/2014 0827   CREATININE 0.96 05/16/2014 0827      Component Value Date/Time   CALCIUM 9.8 05/16/2014 0827   ALKPHOS 66 05/16/2014 0827   AST 17 05/16/2014 0827   ALT 15 05/16/2014 0827   BILITOT 1.0 05/16/2014 0827      IMPRESSION AND PLAN:  1) New dx of left inguinal hernia; pt is pretty uncomfortable when it pops out (symptomatic--"exacerbated").  Plays soccer in Spring/summer. We discussed how exercise could potential affect/aggravate the hernia, esp vigorous exercise that would frequently increase intra-abd pressure.  He elects for referral to general surgeon so he can discuss getting the hernia surgically repaired.  Referral to [facility] ordered today.  An After Visit Summary was printed and given to the patient.  FOLLOW UP: Return if symptoms worsen or fail to improve.  Pt will be making appt for CPE with labs; note 18 lb wt loss over the last 20 months---pt states he has not been eating well b/c of stress stemming from marital separation lately.  Signed:  Crissie Sickles, MD           02/22/2016

## 2016-02-24 DIAGNOSIS — Z9889 Other specified postprocedural states: Secondary | ICD-10-CM | POA: Diagnosis not present

## 2016-02-24 DIAGNOSIS — Z9049 Acquired absence of other specified parts of digestive tract: Secondary | ICD-10-CM | POA: Diagnosis not present

## 2016-02-24 DIAGNOSIS — Z8709 Personal history of other diseases of the respiratory system: Secondary | ICD-10-CM | POA: Diagnosis not present

## 2016-02-24 DIAGNOSIS — K4090 Unilateral inguinal hernia, without obstruction or gangrene, not specified as recurrent: Secondary | ICD-10-CM | POA: Diagnosis not present

## 2016-02-26 ENCOUNTER — Encounter: Payer: Self-pay | Admitting: Family Medicine

## 2016-03-10 HISTORY — PX: INGUINAL HERNIA REPAIR: SUR1180

## 2016-03-15 DIAGNOSIS — K4090 Unilateral inguinal hernia, without obstruction or gangrene, not specified as recurrent: Secondary | ICD-10-CM | POA: Diagnosis not present

## 2016-04-04 ENCOUNTER — Encounter: Payer: Self-pay | Admitting: Family Medicine

## 2017-11-28 ENCOUNTER — Encounter: Payer: Self-pay | Admitting: *Deleted

## 2018-06-20 ENCOUNTER — Ambulatory Visit: Payer: BC Managed Care – PPO | Admitting: Family Medicine

## 2018-06-20 ENCOUNTER — Encounter: Payer: Self-pay | Admitting: Family Medicine

## 2018-06-20 ENCOUNTER — Other Ambulatory Visit: Payer: Self-pay

## 2018-06-20 VITALS — BP 120/73 | HR 68 | Temp 98.1°F | Resp 18 | Ht 75.0 in | Wt 202.0 lb

## 2018-06-20 DIAGNOSIS — S39012A Strain of muscle, fascia and tendon of lower back, initial encounter: Secondary | ICD-10-CM

## 2018-06-20 MED ORDER — CYCLOBENZAPRINE HCL 5 MG PO TABS: 5.0000 mg | ORAL_TABLET | Freq: Three times a day (TID) | ORAL | 0 refills | Status: DC | PRN

## 2018-06-20 MED ORDER — NAPROXEN 500 MG PO TABS: 500.0000 mg | ORAL_TABLET | Freq: Two times a day (BID) | ORAL | 0 refills | Status: DC

## 2018-06-20 NOTE — Patient Instructions (Signed)
Start naproxen every 12 hours with food for 3- 5 days.  Start flexeril before bed- If it does not make you too tired you can take every 8 hrs as needed. If it does make you too sleepy take before bed for 5 days.   Avoid lifting or heavy activity for 2 weeks     Low Back Strain  A strain is a stretch or tear in a muscle or the strong cords of tissue that attach muscle to bone (tendons). Strains of the lower back (lumbar spine) are a common cause of low back pain. A strain occurs when muscles or tendons are torn or are stretched beyond their limits. The muscles may become inflamed, resulting in pain and sudden muscle tightening (spasms). A strain can happen suddenly due to an injury (trauma), or it can develop gradually due to overuse. There are three types of strains:  Grade 1 is a mild strain involving a minor tear of the muscle fibers or tendons. This may cause some pain but no loss of muscle strength.  Grade 2 is a moderate strain involving a partial tear of the muscle fibers or tendons. This causes more severe pain and some loss of muscle strength.  Grade 3 is a severe strain involving a complete tear of the muscle or tendon. This causes severe pain and complete or nearly complete loss of muscle strength. What are the causes? This condition may be caused by:  Trauma, such as a fall or a hit to the body.  Twisting or overstretching the back. This may result from doing activities that require a lot of energy, such as lifting heavy objects. What increases the risk? The following factors may increase your risk of getting this condition:  Playing contact sports.  Participating in sports or activities that put excessive stress on the back and require a lot of bending and twisting, including: ? Lifting weights or heavy objects. ? Gymnastics. ? Soccer. ? Figure skating. ? Snowboarding.  Being overweight or obese.  Having poor strength and flexibility. What are the signs or symptoms?  Symptoms of this condition may include:  Sharp or dull pain in the lower back that does not go away. Pain may extend to the buttocks.  Stiffness.  Limited range of motion.  Inability to stand up straight due to stiffness or pain.  Muscle spasms. How is this diagnosed? This condition may be diagnosed based on:  Your symptoms.  Your medical history.  A physical exam. ? Your health care provider may push on certain areas of your back to determine the source of your pain. ? You may be asked to bend forward, backward, and side to side to assess the severity of your pain and your range of motion.  Imaging tests, such as: ? X-rays. ? MRI. How is this treated? Treatment for this condition may include:  Applying heat and cold to the affected area.  Medicines to help relieve pain and to relax your muscles (muscle relaxants).  NSAIDs to help reduce swelling and discomfort.  Physical therapy. When your symptoms improve, it is important to gradually return to your normal routine as soon as possible to reduce pain, avoid stiffness, and avoid loss of muscle strength. Generally, symptoms should improve within 6 weeks of treatment. However, recovery time varies. Follow these instructions at home: Managing pain, stiffness, and swelling  If directed, apply ice to the injured area during the first 24 hours after your injury. ? Put ice in a plastic bag. ? Place  a towel between your skin and the bag. ? Leave the ice on for 20 minutes, 2-3 times a day.  If directed, apply heat to the affected area as often as told by your health care provider. Use the heat source that your health care provider recommends, such as a moist heat pack or a heating pad. ? Place a towel between your skin and the heat source. ? Leave the heat on for 20-30 minutes. ? Remove the heat if your skin turns bright red. This is especially important if you are unable to feel pain, heat, or cold. You may have a greater risk  of getting burned. Activity  Rest and return to your normal activities as told by your health care provider. Ask your health care provider what activities are safe for you.  Avoid activities that take a lot of effort (are strenuous) for as long as told by your health care provider.  Do exercises as told by your health care provider. General instructions   Take over-the-counter and prescription medicines only as told by your health care provider.  If you have questions or concerns about safety while taking pain medicine, talk with your health care provider.  Do not drive or operate heavy machinery until you know how your pain medicine affects you.  Do not use any tobacco products, such as cigarettes, chewing tobacco, and e-cigarettes. Tobacco can delay bone healing. If you need help quitting, ask your health care provider.  Keep all follow-up visits as told by your health care provider. This is important. How is this prevented?               Warm up and stretch before being active.  Cool down and stretch after being active.  Give your body time to rest between periods of activity.  Avoid: ? Being physically inactive for long periods at a time. ? Exercising or playing sports when you are tired or in pain.  Use correct form when playing sports and lifting heavy objects.  Use good posture when sitting and standing.  Maintain a healthy weight.  Sleep on a mattress with medium firmness to support your back.  Make sure to use equipment that fits you, including shoes that fit well.  Be safe and responsible while being active to avoid falls.  Do at least 150 minutes of moderate-intensity exercise each week, such as brisk walking or water aerobics. Try a form of exercise that takes stress off your back, such as swimming or stationary cycling.  Maintain physical fitness, including: ? Strength. ? Flexibility. ? Cardiovascular fitness. ? Endurance. Contact a health care  provider if:  Your back pain does not improve after 6 weeks of treatment.  Your symptoms get worse. Get help right away if:  Your back pain is severe.  You are unable to stand or walk.  You develop pain in your legs.  You develop weakness in your buttocks or legs.  You have difficulty controlling when you urinate or when you have a bowel movement. This information is not intended to replace advice given to you by your health care provider. Make sure you discuss any questions you have with your health care provider. Document Released: 12/27/2004 Document Revised: 02/22/2016 Document Reviewed: 10/08/2014 Elsevier Interactive Patient Education  2019 [employer].

## 2018-06-20 NOTE — Progress Notes (Signed)
Jack Kelley , [birth year], 48 y.o., male MRN: [id number] Patient Care Team    Relationship Specialty Notifications Start End  McGowen, Adrian Blackwater, MD PCP - General Family Medicine  03/23/12   Fanny Skates, MD Consulting Physician General Surgery  02/26/16     Chief Complaint  Patient presents with  . Back Pain    Lower right back pain x1 week. Started off mild pain and would only feel it at night time. Monday night it started getting worse. Heat makes it better. Feels he may have done something playing soccer.      Subjective: Pt presents for an OV with complaints of right sided low back pain of 1 week duration. He has been playing soccer 2x week for the last month. Last week they played on a hard surface and he noticed his back started to bother him after- mostly at night. Two days ago his pain worsened and started hurting though out the day. Pain is worse with sleeping, laying flat, transitioning positions and bending forward.    Pt has tried advil and heating pad to ease their symptoms. He states he has used a heating pad most of the day today and it is feeling the best it has. He Denies back surgery or prior injury. He has had an xray with thoracic DDD per EMR.   No flowsheet data found.  No Known Allergies Social History   Social History Narrative   Married, 2 children--lives in [place].   College: [employer]   Occupation: Government social research officer for [employer].   No tob/alc, only occasional alcohol.   Exercises in warm months: soccer, basketball.   Regular american diet.   Past Medical History:  Diagnosis Date  . Genital warts    x 1 episode in college--treated and have never recurred.  Marland Kitchen History of migraine headaches    about 1/month--advil migraine helps abort them  . History of rectal bleeding    Rare BRB on toilet tissue; usually after a hard stool.  Only occaasionally   Past Surgical History:  Procedure Laterality Date  . APPENDECTOMY  1986  . CHEST TUBE INSERTION  1989    pneumothorax  . INGUINAL HERNIA REPAIR  03/2016   left, with mesh.  . TONSILLECTOMY  1970s   Family History  Problem Relation Age of Onset  . Hypertension Mother   . Cancer Father        prostate  . Diabetes Paternal Grandfather    Allergies as of 06/20/2018   No Known Allergies     Medication List       Accurate as of June 20, 2018  2:41 PM. If you have any questions, ask your nurse or doctor.        sildenafil 20 MG tablet Commonly known as:  REVATIO 2-5 tabs po qd prn: take 30 min prior to intercourse       All past medical history, surgical history, allergies, family history, immunizations andmedications were updated in the EMR today and reviewed under the history and medication portions of their EMR.     ROS: Negative, with the exception of above mentioned in HPI   Objective:  BP 120/73 (BP Location: Left Arm, Patient Position: Sitting, Cuff Size: Normal)   Pulse 68   Temp 98.1 F (36.7 C) (Temporal)   Resp 18   Ht 6\' 3"  (1.905 m)   Wt 202 lb (91.6 kg)   SpO2 96%   BMI 25.25 kg/m  Body mass index  is 25.25 kg/m. Gen: Afebrile. No acute distress. Nontoxic in appearance, well developed, well nourished.  MSK: no erythema. No soft tissue swelling. Pain w/ lumbar flexion and BSB. MS 5/5 BLE. DTR equal bilaterally. NV intact distally. Skin: no rashes, purpura or petechiae.  Neuro: Normal gait. PERLA. EOMi. Alert. Oriented x3   No exam data present No results found. No results found for this or any previous visit (from the past 24 hour(s)).  Assessment/Plan: Kristapher [place]. is a 48 y.o. male present for OV for  Strain of lumbar region, initial encounter Rest. No lifting. Avoid heavy activity.  Continue heat application PRN  Naproxen BID 3-5 days with food, then PRN.  Flexeril TID PRN 5 days. Caution on sedation.  F/U 2-4 weeks if not improved, sooner if worsening.    Reviewed expectations re: course of current medical issues.  Discussed  self-management of symptoms.  Outlined signs and symptoms indicating need for more acute intervention.  Patient verbalized understanding and all questions were answered.  Patient received an After-Visit Summary.    No orders of the defined types were placed in this encounter.  > 25 minutes spent with patient, >50% of time spent face to face     Note is dictated utilizing voice recognition software. Although note has been proof read prior to signing, occasional typographical errors still can be missed. If any questions arise, please do not hesitate to call for verification.   electronically signed by:  Howard Pouch, DO  [place]

## 2018-07-18 ENCOUNTER — Telehealth: Payer: Self-pay | Admitting: Family Medicine

## 2018-07-18 NOTE — Telephone Encounter (Signed)
Direct transfer from [facility]. Patient request to cancel refill request for Naproxen. He stated that it was an error. Has tried 3 times to get [facility] to delete the request.  However, he is still getting text messages from them, that his prescription has not been approved by his doctor.  He does not need this filled at this time.  Thank you

## 2018-07-18 NOTE — Telephone Encounter (Signed)
No refill request found in chart.

## 2018-07-19 NOTE — Telephone Encounter (Signed)
Pt was called and told he was told on VM that he would need to contact pharmacy to get them to stop sending texts/requests and that we are not sending anything to the pharmacy

## 2019-09-09 ENCOUNTER — Other Ambulatory Visit: Payer: Self-pay

## 2019-09-09 ENCOUNTER — Encounter: Payer: Self-pay | Admitting: Family Medicine

## 2019-09-09 ENCOUNTER — Ambulatory Visit: Payer: BC Managed Care – PPO | Admitting: Family Medicine

## 2019-09-09 VITALS — BP 139/88 | HR 61 | Temp 97.5°F | Resp 16 | Ht 73.75 in | Wt 203.2 lb

## 2019-09-09 DIAGNOSIS — Z0000 Encounter for general adult medical examination without abnormal findings: Secondary | ICD-10-CM | POA: Diagnosis not present

## 2019-09-09 DIAGNOSIS — Z8042 Family history of malignant neoplasm of prostate: Secondary | ICD-10-CM | POA: Diagnosis not present

## 2019-09-09 DIAGNOSIS — Z125 Encounter for screening for malignant neoplasm of prostate: Secondary | ICD-10-CM

## 2019-09-09 DIAGNOSIS — R7301 Impaired fasting glucose: Secondary | ICD-10-CM | POA: Diagnosis not present

## 2019-09-09 DIAGNOSIS — Z1211 Encounter for screening for malignant neoplasm of colon: Secondary | ICD-10-CM | POA: Diagnosis not present

## 2019-09-09 LAB — CBC WITH DIFFERENTIAL/PLATELET
Basophils Absolute: 0 10*3/uL (ref 0.0–0.1)
Basophils Relative: 0.7 % (ref 0.0–3.0)
Eosinophils Absolute: 0.2 10*3/uL (ref 0.0–0.7)
Eosinophils Relative: 2.7 % (ref 0.0–5.0)
HCT: 45.2 % (ref 39.0–52.0)
Hemoglobin: 15.4 g/dL (ref 13.0–17.0)
Lymphocytes Relative: 32.4 % (ref 12.0–46.0)
Lymphs Abs: 1.9 10*3/uL (ref 0.7–4.0)
MCHC: 34.1 g/dL (ref 30.0–36.0)
MCV: 90.7 fl (ref 78.0–100.0)
Monocytes Absolute: 0.6 10*3/uL (ref 0.1–1.0)
Monocytes Relative: 9.5 % (ref 3.0–12.0)
Neutro Abs: 3.3 10*3/uL (ref 1.4–7.7)
Neutrophils Relative %: 54.7 % (ref 43.0–77.0)
Platelets: 231 10*3/uL (ref 150.0–400.0)
RBC: 4.99 Mil/uL (ref 4.22–5.81)
RDW: 13.2 % (ref 11.5–15.5)
WBC: 6 10*3/uL (ref 4.0–10.5)

## 2019-09-09 LAB — LIPID PANEL
Cholesterol: 186 mg/dL (ref 0–200)
HDL: 50.7 mg/dL (ref >39.00)
LDL Cholesterol: 117 mg/dL — ABNORMAL HIGH (ref 0–99)
NonHDL: 135.08
Total CHOL/HDL Ratio: 4
Triglycerides: 92 mg/dL (ref 0.0–149.0)
VLDL: 18.4 mg/dL (ref 0.0–40.0)

## 2019-09-09 LAB — COMPREHENSIVE METABOLIC PANEL
ALT: 12 U/L (ref 0–53)
AST: 17 U/L (ref 0–37)
Albumin: 4.6 g/dL (ref 3.5–5.2)
Alkaline Phosphatase: 71 U/L (ref 39–117)
BUN: 15 mg/dL (ref 6–23)
CO2: 26 mEq/L (ref 19–32)
Calcium: 9.6 mg/dL (ref 8.4–10.5)
Chloride: 104 mEq/L (ref 96–112)
Creatinine, Ser: 1 mg/dL (ref 0.40–1.50)
GFR: 79.14 mL/min (ref >60.00)
Glucose, Bld: 76 mg/dL (ref 70–99)
Potassium: 4.2 mEq/L (ref 3.5–5.1)
Sodium: 140 mEq/L (ref 135–145)
Total Bilirubin: 1.1 mg/dL (ref 0.2–1.2)
Total Protein: 8 g/dL (ref 6.0–8.3)

## 2019-09-09 LAB — PSA: PSA: 2.32 ng/mL (ref 0.10–4.00)

## 2019-09-09 LAB — HEMOGLOBIN A1C: Hgb A1c MFr Bld: 5.2 % (ref 4.6–6.5)

## 2019-09-09 LAB — TSH: TSH: 1.04 u[IU]/mL (ref 0.35–4.50)

## 2019-09-09 NOTE — Patient Instructions (Signed)

## 2019-09-09 NOTE — Progress Notes (Signed)
Office Note 09/09/2019  CC:  Chief Complaint  Patient presents with  . Annual Exam    pt is fasting    HPI:  Jack Kelley. is a 49 y.o. White male who is here for annual health maintenance exam.  Exercises 2x/week, adult soccer league Diet: trying to limit beef.    FAsting gluc via employer recent was 117.  Past Medical History:  Diagnosis Date  . Genital warts    x 1 episode in college--treated and have never recurred.  Marland Kitchen History of migraine headaches    about 1/month--advil migraine helps abort them  . History of rectal bleeding    Rare BRB on toilet tissue; usually after a hard stool.  Only occaasionally    Past Surgical History:  Procedure Laterality Date  . APPENDECTOMY  1986  . CHEST TUBE INSERTION  1989   pneumothorax  . INGUINAL HERNIA REPAIR  03/2016   left, with mesh.  . TONSILLECTOMY  1970s    Family History  Problem Relation Age of Onset  . Hypertension Mother   . Cancer Father        prostate  . Diabetes Paternal Grandfather     Social History   Socioeconomic History  . Marital status: Married    Spouse name: Not on file  . Number of children: Not on file  . Years of education: Not on file  . Highest education level: Not on file  Occupational History  . Not on file  Tobacco Use  . Smoking status: Never Smoker  . Smokeless tobacco: Never Used  Substance and Sexual Activity  . Alcohol use: Yes    Comment: occasionally  . Drug use: No  . Sexual activity: Not on file  Other Topics Concern  . Not on file  Social History Narrative   Married, 2 children--lives in [place].   College: [employer]   Occupation: Government social research officer for [employer].   No tob/alc, only occasional alcohol.   Exercises in warm months: soccer, basketball.   Regular american diet.   Social Determinants of Health   Financial Resource Strain:   . Difficulty of Paying Living Expenses: Not on file  Food Insecurity:   . Worried About Charity fundraiser in the Last  Year: Not on file  . Ran Out of Food in the Last Year: Not on file  Transportation Needs:   . Lack of Transportation (Medical): Not on file  . Lack of Transportation (Non-Medical): Not on file  Physical Activity:   . Days of Exercise per Week: Not on file  . Minutes of Exercise per Session: Not on file  Stress:   . Feeling of Stress : Not on file  Social Connections:   . Frequency of Communication with Friends and Family: Not on file  . Frequency of Social Gatherings with Friends and Family: Not on file  . Attends Religious Services: Not on file  . Active Member of Clubs or Organizations: Not on file  . Attends Archivist Meetings: Not on file  . Marital Status: Not on file  Intimate Partner Violence:   . Fear of Current or Ex-Partner: Not on file  . Emotionally Abused: Not on file  . Physically Abused: Not on file  . Sexually Abused: Not on file    Outpatient Medications Prior to Visit  Medication Sig Dispense Refill  . sildenafil (REVATIO) 20 MG tablet 2-5 tabs po qd prn: take 30 min prior to intercourse (Patient not taking: Reported on  09/09/2019) 50 tablet 6  . cyclobenzaprine (FLEXERIL) 5 MG tablet Take 1 tablet (5 mg total) by mouth 3 (three) times daily as needed for muscle spasms. (Patient not taking: Reported on 09/09/2019) 30 tablet 0  . naproxen (NAPROSYN) 500 MG tablet Take 1 tablet (500 mg total) by mouth 2 (two) times daily with a meal. (Patient not taking: Reported on 09/09/2019) 30 tablet 0   No facility-administered medications prior to visit.    No Known Allergies  ROS Review of Systems  Constitutional: Negative for appetite change, chills, fatigue and fever.  HENT: Negative for congestion, dental problem, ear pain and sore throat.   Eyes: Negative for discharge, redness and visual disturbance.  Respiratory: Negative for cough, chest tightness, shortness of breath and wheezing.   Cardiovascular: Negative for chest pain, palpitations and leg swelling.   Gastrointestinal: Negative for abdominal pain, blood in stool, diarrhea, nausea and vomiting.  Genitourinary: Negative for difficulty urinating, dysuria, flank pain, frequency, hematuria and urgency.  Musculoskeletal: Negative for arthralgias, back pain, joint swelling, myalgias and neck stiffness.  Skin: Negative for pallor and rash.  Neurological: Negative for dizziness, speech difficulty, weakness and headaches.  Hematological: Negative for adenopathy. Does not bruise/bleed easily.  Psychiatric/Behavioral: Negative for confusion and sleep disturbance. The patient is not nervous/anxious.     PE; Vitals with BMI 09/09/2019 06/20/2018 02/22/2016  Height 6' 1.75" 6\' 3"  6' 1.75"  Weight 203 lbs 3 oz 202 lbs 172 lbs 8 oz  BMI 26.27 27.06 23.7  Systolic 628 315 176  Diastolic 88 73 89  Pulse 61 68 74    Gen: Alert, well appearing.  Patient is oriented to person, place, time, and situation. AFFECT: pleasant, lucid thought and speech. ENT: Ears: EACs clear, normal epithelium.  TMs with good light reflex and landmarks bilaterally.  Eyes: no injection, icteris, swelling, or exudate.  EOMI, PERRLA. Nose: no drainage or turbinate edema/swelling.  No injection or focal lesion.  Mouth: lips without lesion/swelling.  Oral mucosa pink and moist.  Dentition intact and without obvious caries or gingival swelling.  Oropharynx without erythema, exudate, or swelling.  Neck: supple/nontender.  No LAD, mass, or TM.  Carotid pulses 2+ bilaterally, without bruits. CV: RRR, no m/r/g.   LUNGS: CTA bilat, nonlabored resps, good aeration in all lung fields. ABD: soft, NT, ND, BS normal.  No hepatospenomegaly or mass.  No bruits. EXT: no clubbing, cyanosis, or edema.  Musculoskeletal: no joint swelling, erythema, warmth, or tenderness.  ROM of all joints intact. Skin - no sores or suspicious lesions or rashes or color changes Rectal exam: negative without mass, lesions or tenderness. Prostate normal size, without  nodule, induraton, tenderness or asymmetry.   Pertinent labs:  Lab Results  Component Value Date   TSH 1.26 05/16/2014   Lab Results  Component Value Date   WBC 5.4 05/16/2014   HGB 15.1 05/16/2014   HCT 44.3 05/16/2014   MCV 87.6 05/16/2014   PLT 219.0 05/16/2014   Lab Results  Component Value Date   CREATININE 0.96 05/16/2014   BUN 13 05/16/2014   NA 138 05/16/2014   K 4.5 05/16/2014   CL 104 05/16/2014   CO2 29 05/16/2014   Lab Results  Component Value Date   ALT 15 05/16/2014   AST 17 05/16/2014   ALKPHOS 66 05/16/2014   BILITOT 1.0 05/16/2014   Lab Results  Component Value Date   CHOL 179 05/16/2014   Lab Results  Component Value Date   HDL 48.60 05/16/2014  Lab Results  Component Value Date   LDLCALC 110 (H) 05/16/2014   Lab Results  Component Value Date   TRIG 101.0 05/16/2014   Lab Results  Component Value Date   CHOLHDL 4 05/16/2014   Lab Results  Component Value Date   PSA 2.11 05/16/2014   PSA 1.78 03/30/2012    ASSESSMENT AND PLAN:   1) IFG: gluc 117 about 3-4 wks ago via employer screening. Repeat fasting gluc today and do HbA1c as well. Discussed dietary changes in some depth today with regard to prediabetes.  2) Health maintenance exam: Reviewed age and gender appropriate health maintenance issues (prudent diet, regular exercise, health risks of tobacco and excessive alcohol, use of seatbelts, fire alarms in home, use of sunscreen).  Also reviewed age and gender appropriate health screening as well as vaccine recommendations. Vaccines: Tdap UTD.  Covid 19-->UTD. Labs: fasting HP + PSA ordered. Prostate ca screening: +FH prost ca father->DRE , PSA. Colon ca screening: average risk patient= as per latest guidelines start screening at any time now-->referral today.  An After Visit Summary was printed and given to the patient.  FOLLOW UP:  Return in about 1 year (around 09/08/2020) for annual CPE (fasting).  Signed:  Crissie Sickles,  MD           09/09/2019

## 2020-09-07 ENCOUNTER — Other Ambulatory Visit: Payer: Self-pay

## 2020-09-08 ENCOUNTER — Encounter: Payer: Self-pay | Admitting: Family Medicine

## 2020-09-08 ENCOUNTER — Ambulatory Visit (INDEPENDENT_AMBULATORY_CARE_PROVIDER_SITE_OTHER): Payer: BC Managed Care – PPO | Admitting: Family Medicine

## 2020-09-08 VITALS — BP 119/85 | HR 74 | Temp 97.8°F | Resp 16 | Ht 74.0 in | Wt 205.8 lb

## 2020-09-08 DIAGNOSIS — Z0000 Encounter for general adult medical examination without abnormal findings: Secondary | ICD-10-CM

## 2020-09-08 DIAGNOSIS — Z125 Encounter for screening for malignant neoplasm of prostate: Secondary | ICD-10-CM | POA: Diagnosis not present

## 2020-09-08 DIAGNOSIS — R7301 Impaired fasting glucose: Secondary | ICD-10-CM | POA: Diagnosis not present

## 2020-09-08 DIAGNOSIS — Z1211 Encounter for screening for malignant neoplasm of colon: Secondary | ICD-10-CM | POA: Diagnosis not present

## 2020-09-08 DIAGNOSIS — Z1283 Encounter for screening for malignant neoplasm of skin: Secondary | ICD-10-CM

## 2020-09-08 LAB — CBC WITH DIFFERENTIAL/PLATELET
Basophils Absolute: 0 10*3/uL (ref 0.0–0.1)
Basophils Relative: 0.4 % (ref 0.0–3.0)
Eosinophils Absolute: 0.1 10*3/uL (ref 0.0–0.7)
Eosinophils Relative: 2.7 % (ref 0.0–5.0)
HCT: 44.1 % (ref 39.0–52.0)
Hemoglobin: 15 g/dL (ref 13.0–17.0)
Lymphocytes Relative: 31.3 % (ref 12.0–46.0)
Lymphs Abs: 1.6 10*3/uL (ref 0.7–4.0)
MCHC: 34.1 g/dL (ref 30.0–36.0)
MCV: 89.8 fl (ref 78.0–100.0)
Monocytes Absolute: 0.5 10*3/uL (ref 0.1–1.0)
Monocytes Relative: 9.2 % (ref 3.0–12.0)
Neutro Abs: 2.9 10*3/uL (ref 1.4–7.7)
Neutrophils Relative %: 56.4 % (ref 43.0–77.0)
Platelets: 227 10*3/uL (ref 150.0–400.0)
RBC: 4.91 Mil/uL (ref 4.22–5.81)
RDW: 12.7 % (ref 11.5–15.5)
WBC: 5.2 10*3/uL (ref 4.0–10.5)

## 2020-09-08 LAB — COMPREHENSIVE METABOLIC PANEL
ALT: 15 U/L (ref 0–53)
AST: 15 U/L (ref 0–37)
Albumin: 4.4 g/dL (ref 3.5–5.2)
Alkaline Phosphatase: 63 U/L (ref 39–117)
BUN: 15 mg/dL (ref 6–23)
CO2: 23 mEq/L (ref 19–32)
Calcium: 9.6 mg/dL (ref 8.4–10.5)
Chloride: 105 mEq/L (ref 96–112)
Creatinine, Ser: 0.92 mg/dL (ref 0.40–1.50)
GFR: 96.75 mL/min (ref >60.00)
Glucose, Bld: 76 mg/dL (ref 70–99)
Potassium: 4.3 mEq/L (ref 3.5–5.1)
Sodium: 138 mEq/L (ref 135–145)
Total Bilirubin: 1.1 mg/dL (ref 0.2–1.2)
Total Protein: 7.7 g/dL (ref 6.0–8.3)

## 2020-09-08 LAB — HEMOGLOBIN A1C: Hgb A1c MFr Bld: 5.2 % (ref 4.6–6.5)

## 2020-09-08 LAB — LIPID PANEL
Cholesterol: 206 mg/dL — ABNORMAL HIGH (ref 0–200)
HDL: 49.7 mg/dL (ref >39.00)
LDL Cholesterol: 125 mg/dL — ABNORMAL HIGH (ref 0–99)
NonHDL: 155.87
Total CHOL/HDL Ratio: 4
Triglycerides: 155 mg/dL — ABNORMAL HIGH (ref 0.0–149.0)
VLDL: 31 mg/dL (ref 0.0–40.0)

## 2020-09-08 LAB — PSA: PSA: 2.21 ng/mL (ref 0.10–4.00)

## 2020-09-08 LAB — TSH: TSH: 1.49 u[IU]/mL (ref 0.35–5.50)

## 2020-09-08 NOTE — Progress Notes (Signed)
Office Note 09/08/2020  CC:  Chief Complaint  Patient presents with   Annual Exam    Pt is fasting    HPI:  Jack Kelley. is a 50 y.o. White male who is here for annual health maintenance exam.  Feeling well, no complaints. Will be getting back into adult soccer league this fall.   Past Medical History:  Diagnosis Date   Genital warts    x 1 episode in college--treated and have never recurred.   History of migraine headaches    about 1/month--advil migraine helps abort them   History of rectal bleeding    Rare BRB on toilet tissue; usually after a hard stool.  Only occaasionally    Past Surgical History:  Procedure Laterality Date   APPENDECTOMY  1986   CHEST TUBE INSERTION  1989   pneumothorax   INGUINAL HERNIA REPAIR  03/2016   left, with mesh.   TONSILLECTOMY  1970s    Family History  Problem Relation Age of Onset   Hypertension Mother    Cancer Father        prostate   Diabetes Paternal Grandfather     Social History   Socioeconomic History   Marital status: Married    Spouse name: Not on file   Number of children: Not on file   Years of education: Not on file   Highest education level: Not on file  Occupational History   Not on file  Tobacco Use   Smoking status: Never   Smokeless tobacco: Never  Substance and Sexual Activity   Alcohol use: Yes    Comment: occasionally   Drug use: No   Sexual activity: Not on file  Other Topics Concern   Not on file  Social History Narrative   Married, 2 children--lives in [place].   College: [employer]   Occupation: Government social research officer for [employer].   No tob/alc, only occasional alcohol.   Exercises in warm months: soccer, basketball.   Regular american diet.   Social Determinants of Health   Financial Resource Strain: Not on file  Food Insecurity: Not on file  Transportation Needs: Not on file  Physical Activity: Not on file  Stress: Not on file  Social Connections: Not on file  Intimate Partner  Violence: Not on file    Outpatient Medications Prior to Visit  Medication Sig Dispense Refill   sildenafil (REVATIO) 20 MG tablet 2-5 tabs po qd prn: take 30 min prior to intercourse 50 tablet 6   No facility-administered medications prior to visit.    No Known Allergies  ROS Review of Systems  Constitutional:  Negative for appetite change, chills, fatigue and fever.  HENT:  Negative for congestion, dental problem, ear pain and sore throat.   Eyes:  Negative for discharge, redness and visual disturbance.  Respiratory:  Negative for cough, chest tightness, shortness of breath and wheezing.   Cardiovascular:  Negative for chest pain, palpitations and leg swelling.  Gastrointestinal:  Negative for abdominal pain, blood in stool, diarrhea, nausea and vomiting.  Genitourinary:  Negative for difficulty urinating, dysuria, flank pain, frequency, hematuria and urgency.  Musculoskeletal:  Negative for arthralgias, back pain, joint swelling, myalgias and neck stiffness.  Skin:  Negative for pallor and rash.  Neurological:  Negative for dizziness, speech difficulty, weakness and headaches.  Hematological:  Negative for adenopathy. Does not bruise/bleed easily.  Psychiatric/Behavioral:  Negative for confusion and sleep disturbance. The patient is not nervous/anxious.    PE; Vitals with BMI 09/08/2020  09/09/2019 06/20/2018  Height '6\' 2"'$  6' 1.75" '6\' 3"'$   Weight 205 lbs 13 oz 203 lbs 3 oz 202 lbs  BMI 26.41 [id number]  Systolic 123456 [id number] 123456  Diastolic 85 88 73  Pulse 74 61 68   Gen: Alert, well appearing.  Patient is oriented to person, place, time, and situation. AFFECT: pleasant, lucid thought and speech. ENT: Ears: EACs clear, normal epithelium.  TMs with good light reflex and landmarks bilaterally.  Eyes: no injection, icteris, swelling, or exudate.  EOMI, PERRLA. Nose: no drainage or turbinate edema/swelling.  No injection or focal lesion.  Mouth: lips without lesion/swelling.  Oral mucosa  pink and moist.  Dentition intact and without obvious caries or gingival swelling.  Oropharynx without erythema, exudate, or swelling.  Neck: supple/nontender.  No LAD, mass, or TM.  Carotid pulses 2+ bilaterally, without bruits. CV: RRR, no m/r/g.   LUNGS: CTA bilat, nonlabored resps, good aeration in all lung fields. ABD: soft, NT, ND, BS normal.  No hepatospenomegaly or mass.  No bruits. EXT: no clubbing, cyanosis, or edema.  Musculoskeletal: no joint swelling, erythema, warmth, or tenderness.  ROM of all joints intact. Skin - no sores or suspicious lesions or rashes or color changes. He has a fair # of typical nevi on trunk.  Pertinent labs:  Lab Results  Component Value Date   TSH 1.04 09/09/2019   Lab Results  Component Value Date   WBC 6.0 09/09/2019   HGB 15.4 09/09/2019   HCT 45.2 09/09/2019   MCV 90.7 09/09/2019   PLT 231.0 09/09/2019   Lab Results  Component Value Date   CREATININE 1.00 09/09/2019   BUN 15 09/09/2019   NA 140 09/09/2019   K 4.2 09/09/2019   CL 104 09/09/2019   CO2 26 09/09/2019   Lab Results  Component Value Date   ALT 12 09/09/2019   AST 17 09/09/2019   ALKPHOS 71 09/09/2019   BILITOT 1.1 09/09/2019   Lab Results  Component Value Date   CHOL 186 09/09/2019   Lab Results  Component Value Date   HDL 50.70 09/09/2019   Lab Results  Component Value Date   LDLCALC 117 (H) 09/09/2019   Lab Results  Component Value Date   TRIG 92.0 09/09/2019   Lab Results  Component Value Date   CHOLHDL 4 09/09/2019   Lab Results  Component Value Date   PSA 2.32 09/09/2019   PSA 2.11 05/16/2014   PSA 1.78 03/30/2012   Lab Results  Component Value Date   HGBA1C 5.2 09/09/2019   ASSESSMENT AND PLAN:   Health maintenance exam: Reviewed age and gender appropriate health maintenance issues (prudent diet, regular exercise, health risks of tobacco and excessive alcohol, use of seatbelts, fire alarms in home, use of sunscreen).  Also reviewed age  and gender appropriate health screening as well as vaccine recommendations. Vaccines: Shingrix->declined today.  Flu->declined today. Labs: fasting HP + PSA ordered + Hba1c (IFG). Prostate ca screening: +FH prost ca father->PSA. Colon ca screening: average risk patient= as per latest guidelines start screening at any time now-->referred to GI today. Referred to derm to get established for annual skin ca screening.  An After Visit Summary was printed and given to the patient.  FOLLOW UP:  Return in about 1 year (around 09/08/2021) for annual CPE (fasting).  Signed:  Crissie Sickles, MD           09/08/2020

## 2020-11-16 ENCOUNTER — Encounter: Payer: Self-pay | Admitting: Gastroenterology

## 2020-12-28 ENCOUNTER — Ambulatory Visit (AMBULATORY_SURGERY_CENTER): Payer: BC Managed Care – PPO

## 2020-12-28 ENCOUNTER — Other Ambulatory Visit: Payer: Self-pay

## 2020-12-28 VITALS — Ht 74.0 in | Wt 200.0 lb

## 2020-12-28 DIAGNOSIS — Z1211 Encounter for screening for malignant neoplasm of colon: Secondary | ICD-10-CM

## 2020-12-28 MED ORDER — PLENVU 140 G PO SOLR: 1.0000 | ORAL | 0 refills | Status: DC

## 2020-12-28 NOTE — Progress Notes (Signed)
Pre visit completed via phone call; Patient verified name, DOB, and address; No egg or soy allergy known to patient  No issues known to pt with past sedation with any surgeries or procedures Patient denies ever being told they had issues or difficulty with intubation  No FH of Malignant Hyperthermia Pt is not on diet pills Pt is not on home 02  Pt is not on blood thinners  Pt denies issues with constipation at this time;  No A fib or A flutter Pt is fully vaccinated for Covid;  Coupon given to pt in PV today, Code to Pharmacy and NO PA's for preps discussed with pt in PV today  Discussed with pt there will be an out-of-pocket cost for prep and that varies from $0 to 70 + dollars - pt verbalized understanding  Due to the COVID-19 pandemic we are asking patients to follow certain guidelines in [place] and the [place]   Pt aware of COVID protocols and [facility] guidelines

## 2021-01-12 ENCOUNTER — Encounter: Payer: Self-pay | Admitting: Gastroenterology

## 2021-01-14 ENCOUNTER — Other Ambulatory Visit: Payer: Self-pay

## 2021-01-14 ENCOUNTER — Encounter: Payer: Self-pay | Admitting: Gastroenterology

## 2021-01-14 ENCOUNTER — Ambulatory Visit (AMBULATORY_SURGERY_CENTER): Payer: BC Managed Care – PPO | Admitting: Gastroenterology

## 2021-01-14 VITALS — BP 112/78 | HR 65 | Temp 98.6°F | Resp 13 | Ht 74.0 in | Wt 200.0 lb

## 2021-01-14 DIAGNOSIS — Z1211 Encounter for screening for malignant neoplasm of colon: Secondary | ICD-10-CM | POA: Diagnosis not present

## 2021-01-14 MED ORDER — SODIUM CHLORIDE 0.9 % IV SOLN: 500.0000 mL | INTRAVENOUS | Status: DC

## 2021-01-14 NOTE — Progress Notes (Signed)
To pacu, VSS. Report to [email or web address] 

## 2021-01-14 NOTE — Patient Instructions (Signed)
Normal colonoscopy- next colonoscopy 10 years!!! Continue your normal medications   YOU HAD AN ENDOSCOPIC PROCEDURE TODAY AT [facility]:   Refer to the procedure report that was given to you for any specific questions about what was found during the examination.  If the procedure report does not answer your questions, please call your gastroenterologist to clarify.  If you requested that your care partner not be given the details of your procedure findings, then the procedure report has been included in a sealed envelope for you to review at your convenience later.  YOU SHOULD EXPECT: Some feelings of bloating in the abdomen. Passage of more gas than usual.  Walking can help get rid of the air that was put into your GI tract during the procedure and reduce the bloating. If you had a lower endoscopy (such as a colonoscopy or flexible sigmoidoscopy) you may notice spotting of blood in your stool or on the toilet paper. If you underwent a bowel prep for your procedure, you may not have a normal bowel movement for a few days.  Please Note:  You might notice some irritation and congestion in your nose or some drainage.  This is from the oxygen used during your procedure.  There is no need for concern and it should clear up in a day or so.  SYMPTOMS TO REPORT IMMEDIATELY:  Following lower endoscopy (colonoscopy or flexible sigmoidoscopy):  Excessive amounts of blood in the stool  Significant tenderness or worsening of abdominal pains  Swelling of the abdomen that is new, acute  Fever of 100F or higher  For urgent or emergent issues, a gastroenterologist can be reached at any hour by calling [phone]. Do not use MyChart messaging for urgent concerns.    DIET:  We do recommend a small meal at first, but then you may proceed to your regular diet.  Drink plenty of fluids but you should avoid alcoholic beverages for 24 hours.  ACTIVITY:  You should plan to take it easy for the  rest of today and you should NOT DRIVE or use heavy machinery until tomorrow (because of the sedation medicines used during the test).    FOLLOW UP: Our staff will call the number listed on your records 48-72 hours following your procedure to check on you and address any questions or concerns that you may have regarding the information given to you following your procedure. If we do not reach you, we will leave a message.  We will attempt to reach you two times.  During this call, we will ask if you have developed any symptoms of COVID 19. If you develop any symptoms (ie: fever, flu-like symptoms, shortness of breath, cough etc.) before then, please call [phone].  If you test positive for Covid 19 in the 2 weeks post procedure, please call and report this information to [place].    SIGNATURES/CONFIDENTIALITY: You and/or your care partner have signed paperwork which will be entered into your electronic medical record.  These signatures attest to the fact that that the information above on your After Visit Summary has been reviewed and is understood.  Full responsibility of the confidentiality of this discharge information lies with you and/or your care-partner.

## 2021-01-14 NOTE — Op Note (Signed)
[place] Patient Name: Jack Kelley , Jack Kelley Age: 51 Referring Jack Kelley:  Date of Birth: 01-05-1971 Gender: Male Account #: [id number] Procedure:                Colonoscopy Indications:              Screening for colorectal malignant neoplasm, This                            is the patient's first colonoscopy Medicines:                Monitored Anesthesia Care Procedure:                Pre-Anesthesia Assessment:                           - Prior to the procedure, a History and Physical                            was performed, and patient medications and                            allergies were reviewed. The patient's tolerance of                            previous anesthesia was also reviewed. The risks                            and benefits of the procedure and the sedation                            options and risks were discussed with the patient.                            All questions were answered, and informed consent                            was obtained. Prior Anticoagulants: The patient has                            taken no previous anticoagulant or antiplatelet                            agents. ASA Grade Assessment: II - A patient with                            mild systemic disease. After reviewing the risks                            and benefits, the patient was deemed in                            satisfactory condition to undergo the procedure.  After obtaining informed consent, the colonoscope                            was passed under direct vision. Throughout the                            procedure, the patient's blood pressure, pulse, and                            oxygen saturations were monitored continuously. The                            Olympus CF-HQ190L ([id number]) Colonoscope was                            introduced through the anus and  advanced to the the                            cecum, identified by appendiceal orifice and                            ileocecal valve. The colonoscopy was performed                            without difficulty. The patient tolerated the                            procedure well. The quality of the bowel                            preparation was excellent. The ileocecal valve,                            appendiceal orifice, and rectum were photographed. Scope In: 10:25:28 AM Scope Out: 10:38:39 AM Scope Withdrawal Time: 0 hours 9 minutes 9 seconds  Total Procedure Duration: 0 hours 13 minutes 11 seconds  Findings:                 The perianal and digital rectal examinations were                            normal.                           The entire examined colon appeared normal on direct                            and retroflexion views. Complications:            No immediate complications. Estimated Blood Loss:     Estimated blood loss: none. Impression:               - The entire examined colon is normal on direct and                            retroflexion views.                           -  No specimens collected. Recommendation:           - Patient has a contact number available for                            emergencies. The signs and symptoms of potential                            delayed complications were discussed with the                            patient. Return to normal activities tomorrow.                            Written discharge instructions were provided to the                            patient.                           - Resume previous diet.                           - Continue present medications.                           - Repeat colonoscopy in 10 years for screening                            purposes. Aylinn Rydberg L. Loletha Carrow, Jack Kelley 01/14/2021 10:41:52 AM This report has been signed electronically.

## 2021-01-14 NOTE — Progress Notes (Signed)
Pt's states no medical or surgical changes since previsit or office visit. 

## 2021-01-14 NOTE — Progress Notes (Signed)
History and Physical:  This patient presents for endoscopic testing for: Encounter Diagnosis  Name Primary?   Special screening for malignant neoplasms, colon Yes    Average risk - first screening colonoscopy. Patient denies chronic abdominal pain, rectal bleeding, constipation or diarrhea.   ROS: Patient denies chest pain or cough   Past Medical History: Past Medical History:  Diagnosis Date   Genital warts    x 1 episode in college--treated and have never recurred.   History of migraine headaches    about 1/month--advil migraine helps abort them   History of rectal bleeding    Rare BRB on toilet tissue; usually after a hard stool.  Only occaasionally     Past Surgical History: Past Surgical History:  Procedure Laterality Date   APPENDECTOMY  1986   CHEST TUBE INSERTION  1989   spontaneous pneumothorax   INGUINAL HERNIA REPAIR Left 03/2016   with mesh.   TONSILLECTOMY  1970s   WISDOM TOOTH EXTRACTION      Allergies: No Known Allergies  Outpatient Meds: Current Outpatient Medications  Medication Sig Dispense Refill   sildenafil (REVATIO) 20 MG tablet 2-5 tabs po qd prn: take 30 min prior to intercourse (Patient not taking: Reported on 12/28/2020) 50 tablet 6   Current Facility-Administered Medications  Medication Dose Route Frequency Provider Last Rate Last Admin   0.9 %  sodium chloride infusion  500 mL Intravenous Continuous Danis, Estill Cotta III, MD          ___________________________________________________________________ Objective   Exam:  BP 106/79    Pulse 75    Temp 98.6 F (37 C) (Temporal)    Ht 6\' 2"  (1.88 m)    Wt 200 lb (90.7 kg)    SpO2 96%    BMI 25.68 kg/m   CV: RRR without murmur, S1/S2 Resp: clear to auscultation bilaterally, normal RR and effort noted GI: soft, no tenderness, with active bowel sounds.   Assessment: Encounter Diagnosis  Name Primary?   Special screening for malignant neoplasms, colon Yes      Plan: Colonoscopy  The benefits and risks of the planned procedure were described in detail with the patient or (when appropriate) their health care proxy.  Risks were outlined as including, but not limited to, bleeding, infection, perforation, adverse medication reaction leading to cardiac or pulmonary decompensation, pancreatitis (if ERCP).  The limitation of incomplete mucosal visualization was also discussed.  No guarantees or warranties were given.    The patient is appropriate for an endoscopic procedure in the ambulatory setting.   - Wilfrid Lund, MD

## 2021-01-18 ENCOUNTER — Telehealth: Payer: Self-pay

## 2021-01-18 NOTE — Telephone Encounter (Signed)
°  Follow up Call-  Call back number 01/14/2021  Post procedure Call Back phone  # [phone]  Permission to leave phone message Yes  Some recent data might be hidden     Patient questions:  Do you have a fever, pain , or abdominal swelling? No. Pain Score  0 *  Have you tolerated food without any problems? Yes.    Have you been able to return to your normal activities? Yes.    Do you have any questions about your discharge instructions: Diet   No. Medications  No. Follow up visit  No.  Do you have questions or concerns about your Care? No.  Actions: * If pain score is 4 or above: No action needed, pain <4.

## 2021-03-17 ENCOUNTER — Ambulatory Visit: Payer: BC Managed Care – PPO | Admitting: Dermatology

## 2021-03-17 ENCOUNTER — Other Ambulatory Visit: Payer: Self-pay

## 2021-03-17 DIAGNOSIS — Z808 Family history of malignant neoplasm of other organs or systems: Secondary | ICD-10-CM

## 2021-03-17 DIAGNOSIS — L570 Actinic keratosis: Secondary | ICD-10-CM

## 2021-03-17 DIAGNOSIS — L918 Other hypertrophic disorders of the skin: Secondary | ICD-10-CM | POA: Diagnosis not present

## 2021-03-17 DIAGNOSIS — D1800 Hemangioma unspecified site: Secondary | ICD-10-CM | POA: Diagnosis not present

## 2021-03-17 DIAGNOSIS — D1801 Hemangioma of skin and subcutaneous tissue: Secondary | ICD-10-CM | POA: Diagnosis not present

## 2021-03-17 DIAGNOSIS — Z1283 Encounter for screening for malignant neoplasm of skin: Secondary | ICD-10-CM | POA: Diagnosis not present

## 2021-03-17 NOTE — Patient Instructions (Addendum)
[place] IN 7-8 MONTHS FOR THE SCALP. JUST CALL us SO WE CAN SEND YOU A SCRIPT  ?

## 2021-03-28 ENCOUNTER — Encounter: Payer: Self-pay | Admitting: Dermatology

## 2021-03-28 NOTE — Progress Notes (Signed)
? ?  New Patient ?  ?Subjective  ?Jack Kelley. is a 51 y.o. male who presents for the following: New Patient (Initial Visit) (Pt here for annual, establish care. Pt has family hx of skin cancer (dad-face). Pt has not been to a derm since he was a child ). ? ?General skin examination ?Location:  ?Duration:  ?Quality:  ?Associated Signs/Symptoms: ?Modifying Factors:  ?Severity:  ?Timing: ?Context:  ? ? ?The following portions of the chart were reviewed this encounter and updated as appropriate:  Tobacco  Allergies  Meds  Problems  Med Hx  Surg Hx  Fam Hx   ?  ? ?Objective  ?Well appearing patient in no apparent distress; mood and affect are within normal limits. ?Birth mole- L Back Waistline, 1.8 cm with expected dermoscopy.  (Later stable).  Otherwise no atypical pigmented lesions or nonmelanoma skin cancer. ? ?Multiple 1 mm smooth red dermal papules ? ?Right Flank, Right Popliteal Fossa ?Pedunculated 2 mm flesh-colored papules ? ?Chest - Medial [facility]) ?Bluish soft dermal 3 mm papule, typical dermoscopy ? ?Scalp ?Multiple small pink gritty crusts ? ? ? ?A full examination was performed including scalp, head, eyes, ears, nose, lips, neck, chest, axillae, abdomen, back, buttocks, bilateral upper extremities, bilateral lower extremities, hands, feet, fingers, toes, fingernails, and toenails. All findings within normal limits unless otherwise noted below. ? ? ?Assessment & Plan  ?Screening exam for skin cancer ? ?Self examine with spouse twice annually.  Continued ultraviolet protection. ? ?Cherry angioma ? ?No intervention necessary ? ?Skin tag (2) ?Right Popliteal Fossa; Right Flank ? ?May choose to remove in future ? ?Hemangioma, unspecified site ?Chest - Medial [facility]) ? ?Leave if stable ? ?Solar keratosis ?Scalp ? ?[place] in 7-8 months; he can contact us if he has any questions about this.  Goal will be 28 total applications but if nightly application causes too much irritation he will contact  us. ? ? ?

## 2021-09-09 ENCOUNTER — Encounter: Payer: Self-pay | Admitting: Family Medicine

## 2021-09-09 ENCOUNTER — Ambulatory Visit (INDEPENDENT_AMBULATORY_CARE_PROVIDER_SITE_OTHER): Payer: BC Managed Care – PPO | Admitting: Family Medicine

## 2021-09-09 VITALS — BP 104/73 | HR 75 | Temp 97.8°F | Ht 74.0 in | Wt 206.2 lb

## 2021-09-09 DIAGNOSIS — Z0000 Encounter for general adult medical examination without abnormal findings: Secondary | ICD-10-CM

## 2021-09-09 DIAGNOSIS — Z125 Encounter for screening for malignant neoplasm of prostate: Secondary | ICD-10-CM | POA: Diagnosis not present

## 2021-09-09 LAB — COMPREHENSIVE METABOLIC PANEL
ALT: 15 U/L (ref 0–53)
AST: 15 U/L (ref 0–37)
Albumin: 4.6 g/dL (ref 3.5–5.2)
Alkaline Phosphatase: 70 U/L (ref 39–117)
BUN: 22 mg/dL (ref 6–23)
CO2: 25 mEq/L (ref 19–32)
Calcium: 9.7 mg/dL (ref 8.4–10.5)
Chloride: 104 mEq/L (ref 96–112)
Creatinine, Ser: 1.1 mg/dL (ref 0.40–1.50)
GFR: 77.53 mL/min (ref >60.00)
Glucose, Bld: 79 mg/dL (ref 70–99)
Potassium: 4.3 mEq/L (ref 3.5–5.1)
Sodium: 139 mEq/L (ref 135–145)
Total Bilirubin: 1.2 mg/dL (ref 0.2–1.2)
Total Protein: 8 g/dL (ref 6.0–8.3)

## 2021-09-09 LAB — CBC WITH DIFFERENTIAL/PLATELET
Basophils Absolute: 0 10*3/uL (ref 0.0–0.1)
Basophils Relative: 0.7 % (ref 0.0–3.0)
Eosinophils Absolute: 0.1 10*3/uL (ref 0.0–0.7)
Eosinophils Relative: 1.1 % (ref 0.0–5.0)
HCT: 45.2 % (ref 39.0–52.0)
Hemoglobin: 15.3 g/dL (ref 13.0–17.0)
Lymphocytes Relative: 34.1 % (ref 12.0–46.0)
Lymphs Abs: 1.9 10*3/uL (ref 0.7–4.0)
MCHC: 33.9 g/dL (ref 30.0–36.0)
MCV: 89.8 fl (ref 78.0–100.0)
Monocytes Absolute: 0.5 10*3/uL (ref 0.1–1.0)
Monocytes Relative: 9.2 % (ref 3.0–12.0)
Neutro Abs: 3.1 10*3/uL (ref 1.4–7.7)
Neutrophils Relative %: 54.9 % (ref 43.0–77.0)
Platelets: 245 10*3/uL (ref 150.0–400.0)
RBC: 5.04 Mil/uL (ref 4.22–5.81)
RDW: 13.2 % (ref 11.5–15.5)
WBC: 5.7 10*3/uL (ref 4.0–10.5)

## 2021-09-09 LAB — LIPID PANEL
Cholesterol: 204 mg/dL — ABNORMAL HIGH (ref 0–200)
HDL: 54 mg/dL (ref >39.00)
LDL Cholesterol: 128 mg/dL — ABNORMAL HIGH (ref 0–99)
NonHDL: 150.09
Total CHOL/HDL Ratio: 4
Triglycerides: 108 mg/dL (ref 0.0–149.0)
VLDL: 21.6 mg/dL (ref 0.0–40.0)

## 2021-09-09 LAB — TSH: TSH: 1.37 u[IU]/mL (ref 0.35–5.50)

## 2021-09-09 LAB — PSA: PSA: 2.04 ng/mL (ref 0.10–4.00)

## 2021-09-09 NOTE — Progress Notes (Signed)
Office Note 09/09/2021  CC:  Chief Complaint  Patient presents with   Annual Exam    Pt is fasting   HPI:  Patient is a 51 y.o. male who is here for annual health maintenance exam. Patient is feeling well.  He has no acute concerns. He is not as active with exercise as a wants to be, sometimes play some soccer.   Past Medical History:  Diagnosis Date   Genital warts    x 1 episode in college--treated and have never recurred.   History of migraine headaches    about 1/month--advil migraine helps abort them   History of rectal bleeding    Rare BRB on toilet tissue; usually after a hard stool.  Only occaasionally    Past Surgical History:  Procedure Laterality Date   APPENDECTOMY  1986   CHEST TUBE INSERTION  1989   spontaneous pneumothorax   COLONOSCOPY     01/2021 NORMAL. recall 2033   INGUINAL HERNIA REPAIR Left 03/2016   with mesh.   TONSILLECTOMY  1970s   WISDOM TOOTH EXTRACTION      Family History  Problem Relation Age of Onset   Hypertension Mother    Cancer Father        prostate   Diabetes Paternal Grandfather    Colon polyps Neg Hx    Colon cancer Neg Hx    Esophageal cancer Neg Hx    Rectal cancer Neg Hx    Stomach cancer Neg Hx     Social History   Socioeconomic History   Marital status: Married    Spouse name: Not on file   Number of children: Not on file   Years of education: Not on file   Highest education level: Not on file  Occupational History   Not on file  Tobacco Use   Smoking status: Never   Smokeless tobacco: Never  Vaping Use   Vaping Use: Never used  Substance and Sexual Activity   Alcohol use: Not Currently    Alcohol/week: 0.0 - 4.0 standard drinks of alcohol    Comment: occasionally   Drug use: No   Sexual activity: Not on file  Other Topics Concern   Not on file  Social History Narrative   Married, 2 children--lives in [place].   College: [employer]   Occupation: Government social research officer for [employer].   No tob/alc, only  occasional alcohol.   Exercises in warm months: soccer, basketball.   Regular american diet.   Social Determinants of Health   Financial Resource Strain: Not on file  Food Insecurity: Not on file  Transportation Needs: Not on file  Physical Activity: Not on file  Stress: Not on file  Social Connections: Not on file  Intimate Partner Violence: Not on file    Outpatient Medications Prior to Visit  Medication Sig Dispense Refill   sildenafil (REVATIO) 20 MG tablet 2-5 tabs po qd prn: take 30 min prior to intercourse (Patient not taking: Reported on 12/28/2020) 50 tablet 6   No facility-administered medications prior to visit.    Allergies  Allergen Reactions   Aspirin     Pt had a reaction as a child, therefore he decided to never take it     ROS Review of Systems  Constitutional:  Negative for appetite change, chills, fatigue and fever.  HENT:  Negative for congestion, dental problem, ear pain and sore throat.   Eyes:  Negative for discharge, redness and visual disturbance.  Respiratory:  Negative for cough, chest  tightness, shortness of breath and wheezing.   Cardiovascular:  Negative for chest pain, palpitations and leg swelling.  Gastrointestinal:  Negative for abdominal pain, blood in stool, diarrhea, nausea and vomiting.  Genitourinary:  Negative for difficulty urinating, dysuria, flank pain, frequency, hematuria and urgency.  Musculoskeletal:  Negative for arthralgias, back pain, joint swelling, myalgias and neck stiffness.  Skin:  Negative for pallor and rash.  Neurological:  Negative for dizziness, speech difficulty, weakness and headaches.  Hematological:  Negative for adenopathy. Does not bruise/bleed easily.  Psychiatric/Behavioral:  Negative for confusion and sleep disturbance. The patient is not nervous/anxious.     PE;    09/09/2021    8:07 AM 01/14/2021   11:02 AM 01/14/2021   10:52 AM  Vitals with BMI  Height '6\' 2"'$     Weight 206 lbs 3 oz    BMI 16.10     Systolic [phone]  Diastolic 73 78 68  Pulse 75 65 67    Gen: Alert, well appearing.  Patient is oriented to person, place, time, and situation. AFFECT: pleasant, lucid thought and speech. ENT: Ears: EACs clear, normal epithelium.  TMs with good light reflex and landmarks bilaterally.  Eyes: no injection, icteris, swelling, or exudate.  EOMI, PERRLA. Nose: no drainage or turbinate edema/swelling.  No injection or focal lesion.  Mouth: lips without lesion/swelling.  Oral mucosa pink and moist.  Dentition intact and without obvious caries or gingival swelling.  Oropharynx without erythema, exudate, or swelling.  Neck: supple/nontender.  No LAD, mass, or TM.  Carotid pulses 2+ bilaterally, without bruits. CV: RRR, no m/r/g.   LUNGS: CTA bilat, nonlabored resps, good aeration in all lung fields. ABD: soft, NT, ND, BS normal.  No hepatospenomegaly or mass.  No bruits. EXT: no clubbing, cyanosis, or edema.  Musculoskeletal: no joint swelling, erythema, warmth, or tenderness.  ROM of all joints intact. Skin - no sores or suspicious lesions or rashes or color changes  Pertinent labs:  Lab Results  Component Value Date   TSH 1.49 09/08/2020   Lab Results  Component Value Date   WBC 5.2 09/08/2020   HGB 15.0 09/08/2020   HCT 44.1 09/08/2020   MCV 89.8 09/08/2020   PLT 227.0 09/08/2020   Lab Results  Component Value Date   CREATININE 0.92 09/08/2020   BUN 15 09/08/2020   NA 138 09/08/2020   K 4.3 09/08/2020   CL 105 09/08/2020   CO2 23 09/08/2020   Lab Results  Component Value Date   ALT 15 09/08/2020   AST 15 09/08/2020   ALKPHOS 63 09/08/2020   BILITOT 1.1 09/08/2020   Lab Results  Component Value Date   CHOL 206 (H) 09/08/2020   Lab Results  Component Value Date   HDL 49.70 09/08/2020   Lab Results  Component Value Date   LDLCALC 125 (H) 09/08/2020   Lab Results  Component Value Date   TRIG 155.0 (H) 09/08/2020   Lab Results  Component Value Date   CHOLHDL  4 09/08/2020   Lab Results  Component Value Date   PSA 2.21 09/08/2020   PSA 2.32 09/09/2019   PSA 2.11 05/16/2014   Lab Results  Component Value Date   HGBA1C 5.2 09/08/2020   ASSESSMENT AND PLAN:   No problem-specific Assessment & Plan notes found for this encounter.  Health maintenance exam: Reviewed age and gender appropriate health maintenance issues (prudent diet, regular exercise, health risks of tobacco and excessive alcohol, use of seatbelts, fire alarms in  home, use of sunscreen).  Also reviewed age and gender appropriate health screening as well as vaccine recommendations. Vaccines: shingrix->he'll consider.  Flu->declined.  Tdap is UTD. Labs: HP + PSA Prostate ca screening: PSA today Colon ca screening: recall 2033.  An After Visit Summary was printed and given to the patient.  FOLLOW UP:  Return in about 1 year (around 09/10/2022) for annual CPE (fasting).  Signed:  Crissie Sickles, MD           09/09/2021

## 2021-09-09 NOTE — Patient Instructions (Signed)
Health Maintenance, Male Adopting a healthy lifestyle and getting preventive care are important in promoting health and wellness. Ask your health care provider about: The right schedule for you to have regular tests and exams. Things you can do on your own to prevent diseases and keep yourself healthy. What should I know about diet, weight, and exercise? Eat a healthy diet  Eat a diet that includes plenty of vegetables, fruits, low-fat dairy products, and lean protein. Do not eat a lot of foods that are high in solid fats, added sugars, or sodium. Maintain a healthy weight Body mass index (BMI) is a measurement that can be used to identify possible weight problems. It estimates body fat based on height and weight. Your health care provider can help determine your BMI and help you achieve or maintain a healthy weight. Get regular exercise Get regular exercise. This is one of the most important things you can do for your health. Most adults should: Exercise for at least 150 minutes each week. The exercise should increase your heart rate and make you sweat (moderate-intensity exercise). Do strengthening exercises at least twice a week. This is in addition to the moderate-intensity exercise. Spend less time sitting. Even light physical activity can be beneficial. Watch cholesterol and blood lipids Have your blood tested for lipids and cholesterol at 52 years of age, then have this test every 5 years. You may need to have your cholesterol levels checked more often if: Your lipid or cholesterol levels are high. You are older than 52 years of age. You are at high risk for heart disease. What should I know about cancer screening? Many types of cancers can be detected early and may often be prevented. Depending on your health history and family history, you may need to have cancer screening at various ages. This may include screening for: Colorectal cancer. Prostate cancer. Skin cancer. Lung  cancer. What should I know about heart disease, diabetes, and high blood pressure? Blood pressure and heart disease High blood pressure causes heart disease and increases the risk of stroke. This is more likely to develop in people who have high blood pressure readings or are overweight. Talk with your health care provider about your target blood pressure readings. Have your blood pressure checked: Every 3-5 years if you are 18-39 years of age. Every year if you are 40 years old or older. If you are between the ages of 65 and 75 and are a current or former smoker, ask your health care provider if you should have a one-time screening for abdominal aortic aneurysm (AAA). Diabetes Have regular diabetes screenings. This checks your fasting blood sugar level. Have the screening done: Once every three years after age 45 if you are at a normal weight and have a low risk for diabetes. More often and at a younger age if you are overweight or have a high risk for diabetes. What should I know about preventing infection? Hepatitis B If you have a higher risk for hepatitis B, you should be screened for this virus. Talk with your health care provider to find out if you are at risk for hepatitis B infection. Hepatitis C Blood testing is recommended for: Everyone born from 1945 through 1965. Anyone with known risk factors for hepatitis C. Sexually transmitted infections (STIs) You should be screened each year for STIs, including gonorrhea and chlamydia, if: You are sexually active and are younger than 52 years of age. You are older than 52 years of age and your   health care provider tells you that you are at risk for this type of infection. Your sexual activity has changed since you were last screened, and you are at increased risk for chlamydia or gonorrhea. Ask your health care provider if you are at risk. Ask your health care provider about whether you are at high risk for HIV. Your health care provider  may recommend a prescription medicine to help prevent HIV infection. If you choose to take medicine to prevent HIV, you should first get tested for HIV. You should then be tested every 3 months for as long as you are taking the medicine. Follow these instructions at home: Alcohol use Do not drink alcohol if your health care provider tells you not to drink. If you drink alcohol: Limit how much you have to 0-2 drinks a day. Know how much alcohol is in your drink. In the [place] one drink equals one 12 oz bottle of beer (355 mL), one 5 oz glass of wine (148 mL), or one 1 oz glass of hard liquor (44 mL). Lifestyle Do not use any products that contain nicotine or tobacco. These products include cigarettes, chewing tobacco, and vaping devices, such as e-cigarettes. If you need help quitting, ask your health care provider. Do not use street drugs. Do not share needles. Ask your health care provider for help if you need support or information about quitting drugs. General instructions Schedule regular health, dental, and eye exams. Stay current with your vaccines. Tell your health care provider if: You often feel depressed. You have ever been abused or do not feel safe at home. Summary Adopting a healthy lifestyle and getting preventive care are important in promoting health and wellness. Follow your health care provider's instructions about healthy diet, exercising, and getting tested or screened for diseases. Follow your health care provider's instructions on monitoring your cholesterol and blood pressure. This information is not intended to replace advice given to you by your health care provider. Make sure you discuss any questions you have with your health care provider. Document Revised: 05/18/2020 Document Reviewed: 05/18/2020 Elsevier Patient Education  2023 Elsevier Inc.  

## 2021-09-14 ENCOUNTER — Encounter: Payer: BC Managed Care – PPO | Admitting: Family Medicine

## 2022-08-25 ENCOUNTER — Encounter (INDEPENDENT_AMBULATORY_CARE_PROVIDER_SITE_OTHER): Payer: Self-pay

## 2022-09-19 NOTE — Patient Instructions (Incomplete)
Health Maintenance, Male Adopting a healthy lifestyle and getting preventive care are important in promoting health and wellness. Ask your health care provider about: The right schedule for you to have regular tests and exams. Things you can do on your own to prevent diseases and keep yourself healthy. What should I know about diet, weight, and exercise? Eat a healthy diet  Eat a diet that includes plenty of vegetables, fruits, low-fat dairy products, and lean protein. Do not eat a lot of foods that are high in solid fats, added sugars, or sodium. Maintain a healthy weight Body mass index (BMI) is a measurement that can be used to identify possible weight problems. It estimates body fat based on height and weight. Your health care provider can help determine your BMI and help you achieve or maintain a healthy weight. Get regular exercise Get regular exercise. This is one of the most important things you can do for your health. Most adults should: Exercise for at least 150 minutes each week. The exercise should increase your heart rate and make you sweat (moderate-intensity exercise). Do strengthening exercises at least twice a week. This is in addition to the moderate-intensity exercise. Spend less time sitting. Even light physical activity can be beneficial. Watch cholesterol and blood lipids Have your blood tested for lipids and cholesterol at 53 years of age, then have this test every 5 years. You may need to have your cholesterol levels checked more often if: Your lipid or cholesterol levels are high. You are older than 53 years of age. You are at high risk for heart disease. What should I know about cancer screening? Many types of cancers can be detected early and may often be prevented. Depending on your health history and family history, you may need to have cancer screening at various ages. This may include screening for: Colorectal cancer. Prostate cancer. Skin cancer. Lung  cancer. What should I know about heart disease, diabetes, and high blood pressure? Blood pressure and heart disease High blood pressure causes heart disease and increases the risk of stroke. This is more likely to develop in people who have high blood pressure readings or are overweight. Talk with your health care provider about your target blood pressure readings. Have your blood pressure checked: Every 3-5 years if you are 18-39 years of age. Every year if you are 40 years old or older. If you are between the ages of 65 and 75 and are a current or former smoker, ask your health care provider if you should have a one-time screening for abdominal aortic aneurysm (AAA). Diabetes Have regular diabetes screenings. This checks your fasting blood sugar level. Have the screening done: Once every three years after age 45 if you are at a normal weight and have a low risk for diabetes. More often and at a younger age if you are overweight or have a high risk for diabetes. What should I know about preventing infection? Hepatitis B If you have a higher risk for hepatitis B, you should be screened for this virus. Talk with your health care provider to find out if you are at risk for hepatitis B infection. Hepatitis C Blood testing is recommended for: Everyone born from 1945 through 1965. Anyone with known risk factors for hepatitis C. Sexually transmitted infections (STIs) You should be screened each year for STIs, including gonorrhea and chlamydia, if: You are sexually active and are younger than 53 years of age. You are older than 53 years of age and your   health care provider tells you that you are at risk for this type of infection. Your sexual activity has changed since you were last screened, and you are at increased risk for chlamydia or gonorrhea. Ask your health care provider if you are at risk. Ask your health care provider about whether you are at high risk for HIV. Your health care provider  may recommend a prescription medicine to help prevent HIV infection. If you choose to take medicine to prevent HIV, you should first get tested for HIV. You should then be tested every 3 months for as long as you are taking the medicine. Follow these instructions at home: Alcohol use Do not drink alcohol if your health care provider tells you not to drink. If you drink alcohol: Limit how much you have to 0-2 drinks a day. Know how much alcohol is in your drink. In the [place] one drink equals one 12 oz bottle of beer (355 mL), one 5 oz glass of wine (148 mL), or one 1 oz glass of hard liquor (44 mL). Lifestyle Do not use any products that contain nicotine or tobacco. These products include cigarettes, chewing tobacco, and vaping devices, such as e-cigarettes. If you need help quitting, ask your health care provider. Do not use street drugs. Do not share needles. Ask your health care provider for help if you need support or information about quitting drugs. General instructions Schedule regular health, dental, and eye exams. Stay current with your vaccines. Tell your health care provider if: You often feel depressed. You have ever been abused or do not feel safe at home. Summary Adopting a healthy lifestyle and getting preventive care are important in promoting health and wellness. Follow your health care provider's instructions about healthy diet, exercising, and getting tested or screened for diseases. Follow your health care provider's instructions on monitoring your cholesterol and blood pressure. This information is not intended to replace advice given to you by your health care provider. Make sure you discuss any questions you have with your health care provider. Document Revised: 05/18/2020 Document Reviewed: 05/18/2020 Elsevier Patient Education  2024 [employer] Inc.  

## 2022-09-20 ENCOUNTER — Ambulatory Visit (INDEPENDENT_AMBULATORY_CARE_PROVIDER_SITE_OTHER): Payer: BC Managed Care – PPO | Admitting: Family Medicine

## 2022-09-20 ENCOUNTER — Encounter: Payer: Self-pay | Admitting: Family Medicine

## 2022-09-20 VITALS — BP 106/68 | HR 78 | Temp 97.9°F | Ht 74.0 in | Wt 210.4 lb

## 2022-09-20 DIAGNOSIS — Z0000 Encounter for general adult medical examination without abnormal findings: Secondary | ICD-10-CM

## 2022-09-20 DIAGNOSIS — R051 Acute cough: Secondary | ICD-10-CM

## 2022-09-20 DIAGNOSIS — Z1283 Encounter for screening for malignant neoplasm of skin: Secondary | ICD-10-CM

## 2022-09-20 DIAGNOSIS — Z125 Encounter for screening for malignant neoplasm of prostate: Secondary | ICD-10-CM

## 2022-09-20 LAB — CBC WITH DIFFERENTIAL/PLATELET
Basophils Absolute: 0 10*3/uL (ref 0.0–0.1)
Basophils Relative: 0.7 % (ref 0.0–3.0)
Eosinophils Absolute: 0.2 10*3/uL (ref 0.0–0.7)
Eosinophils Relative: 3.9 % (ref 0.0–5.0)
HCT: 44 % (ref 39.0–52.0)
Hemoglobin: 14.7 g/dL (ref 13.0–17.0)
Lymphocytes Relative: 30.9 % (ref 12.0–46.0)
Lymphs Abs: 1.7 10*3/uL (ref 0.7–4.0)
MCHC: 33.5 g/dL (ref 30.0–36.0)
MCV: 89.6 fl (ref 78.0–100.0)
Monocytes Absolute: 0.7 10*3/uL (ref 0.1–1.0)
Monocytes Relative: 11.9 % (ref 3.0–12.0)
Neutro Abs: 2.9 10*3/uL (ref 1.4–7.7)
Neutrophils Relative %: 52.6 % (ref 43.0–77.0)
Platelets: 243 10*3/uL (ref 150.0–400.0)
RBC: 4.91 Mil/uL (ref 4.22–5.81)
RDW: 12.8 % (ref 11.5–15.5)
WBC: 5.5 10*3/uL (ref 4.0–10.5)

## 2022-09-20 LAB — COMPREHENSIVE METABOLIC PANEL
ALT: 17 U/L (ref 0–53)
AST: 16 U/L (ref 0–37)
Albumin: 4.1 g/dL (ref 3.5–5.2)
Alkaline Phosphatase: 67 U/L (ref 39–117)
BUN: 15 mg/dL (ref 6–23)
CO2: 25 meq/L (ref 19–32)
Calcium: 9.4 mg/dL (ref 8.4–10.5)
Chloride: 107 meq/L (ref 96–112)
Creatinine, Ser: 0.91 mg/dL (ref 0.40–1.50)
GFR: 96.64 mL/min (ref >60.00)
Glucose, Bld: 82 mg/dL (ref 70–99)
Potassium: 4.3 meq/L (ref 3.5–5.1)
Sodium: 140 meq/L (ref 135–145)
Total Bilirubin: 0.9 mg/dL (ref 0.2–1.2)
Total Protein: 7.4 g/dL (ref 6.0–8.3)

## 2022-09-20 LAB — LIPID PANEL
Cholesterol: 190 mg/dL (ref 0–200)
HDL: 51.8 mg/dL (ref >39.00)
LDL Cholesterol: 113 mg/dL — ABNORMAL HIGH (ref 0–99)
NonHDL: 138.63
Total CHOL/HDL Ratio: 4
Triglycerides: 128 mg/dL (ref 0.0–149.0)
VLDL: 25.6 mg/dL (ref 0.0–40.0)

## 2022-09-20 LAB — TSH: TSH: 1.29 u[IU]/mL (ref 0.35–5.50)

## 2022-09-20 LAB — PSA: PSA: 3.65 ng/mL (ref 0.10–4.00)

## 2022-09-20 NOTE — Progress Notes (Addendum)
Office Note 09/20/2022  CC:  Chief Complaint  Patient presents with   Annual Exam    Fasting CPE.    HPI:  Patient is a 52 y.o. male who is here for annual health maintenance exam. Jack Kelley has felt well up until a couple days ago when he started developing a cough.  He had a little bit of nasal congestion but mostly feels like a chest cough.  He feels like he has to get stuff out of his chest. No fever, chills, wheezing, body aches, or malaise. He says he gets this every fall and it seems to linger for couple months.  He plays soccer a couple days a week for exercise. He eats a reasonably healthy diet. Work at [employer] going well.  Past Medical History:  Diagnosis Date   Genital warts    x 1 episode in college--treated and have never recurred.   History of migraine headaches    about 1/month--advil migraine helps abort them   History of rectal bleeding    Rare BRB on toilet tissue; usually after a hard stool.  Only occaasionally    Past Surgical History:  Procedure Laterality Date   APPENDECTOMY  1986   CHEST TUBE INSERTION  1989   spontaneous pneumothorax   COLONOSCOPY     01/2021 NORMAL. recall 2033   INGUINAL HERNIA REPAIR Left 03/2016   with mesh.   TONSILLECTOMY  1970s   WISDOM TOOTH EXTRACTION      Family History  Problem Relation Age of Onset   Hypertension Mother    Cancer Father        prostate   Diabetes Paternal Grandfather    Colon polyps Neg Hx    Colon cancer Neg Hx    Esophageal cancer Neg Hx    Rectal cancer Neg Hx    Stomach cancer Neg Hx     Social History   Socioeconomic History   Marital status: Married    Spouse name: Not on file   Number of children: Not on file   Years of education: Not on file   Highest education level: Not on file  Occupational History   Not on file  Tobacco Use   Smoking status: Never   Smokeless tobacco: Never  Vaping Use   Vaping status: Never Used  Substance and Sexual Activity   Alcohol use: Not  Currently    Alcohol/week: 0.0 - 4.0 standard drinks of alcohol    Comment: occasionally   Drug use: No   Sexual activity: Not on file  Other Topics Concern   Not on file  Social History Narrative   Married, 2 children--lives in [place].   College: [employer]   Occupation: Emergency planning/management officer for [employer].   No tob/alc, only occasional alcohol.   Exercises in warm months: soccer, basketball.   Regular american diet.   Social Determinants of Health   Financial Resource Strain: Not on file  Food Insecurity: Not on file  Transportation Needs: Not on file  Physical Activity: Not on file  Stress: Not on file  Social Connections: Not on file  Intimate Partner Violence: Not on file    Outpatient Medications Prior to Visit  Medication Sig Dispense Refill   sildenafil (REVATIO) 20 MG tablet 2-5 tabs po qd prn: take 30 min prior to intercourse (Patient not taking: Reported on 12/28/2020) 50 tablet 6   No facility-administered medications prior to visit.    Allergies  Allergen Reactions   Aspirin     Pt  had a reaction as a child, therefore he decided to never take it     Review of Systems  Constitutional:  Negative for appetite change, chills, fatigue and fever.  HENT:  Positive for congestion. Negative for dental problem, ear pain and sore throat.   Eyes:  Negative for discharge, redness and visual disturbance.  Respiratory:  Positive for cough. Negative for chest tightness, shortness of breath and wheezing.   Cardiovascular:  Negative for chest pain, palpitations and leg swelling.  Gastrointestinal:  Negative for abdominal pain, blood in stool, diarrhea, nausea and vomiting.  Genitourinary:  Negative for difficulty urinating, dysuria, flank pain, frequency, hematuria and urgency.  Musculoskeletal:  Negative for arthralgias, back pain, joint swelling, myalgias and neck stiffness.  Skin:  Negative for pallor and rash.  Neurological:  Negative for dizziness, speech difficulty, weakness and  headaches.  Hematological:  Negative for adenopathy. Does not bruise/bleed easily.  Psychiatric/Behavioral:  Negative for confusion and sleep disturbance. The patient is not nervous/anxious.     PE;    09/20/2022    8:05 AM 09/09/2021    8:07 AM 01/14/2021   11:02 AM  Vitals with BMI  Height 6\' 2"  6\' 2"    Weight 210 lbs 6 oz 206 lbs 3 oz   BMI 27 26.46   Systolic 106 104 161  Diastolic 68 73 78  Pulse 78 75 65    Gen: Alert, well appearing.  Patient is oriented to person, place, time, and situation. AFFECT: pleasant, lucid thought and speech. ENT: Ears: EACs clear, normal epithelium.  TMs with good light reflex and landmarks bilaterally.  Eyes: no injection, icteris, swelling, or exudate.  EOMI, PERRLA. Nose: no drainage or turbinate edema/swelling.  No injection or focal lesion.  Mouth: lips without lesion/swelling.  Oral mucosa pink and moist.  Dentition intact and without obvious caries or gingival swelling.  Oropharynx without erythema, exudate, or swelling.  Neck: supple/nontender.  No LAD, mass, or TM.  Carotid pulses 2+ bilaterally, without bruits. CV: RRR, no m/r/g.   LUNGS: CTA bilat, nonlabored resps, good aeration in all lung fields. ABD: soft, NT, ND, BS normal.  No hepatospenomegaly or mass.  No bruits. EXT: no clubbing, cyanosis, or edema.  Musculoskeletal: no joint swelling, erythema, warmth, or tenderness.  ROM of all joints intact. Skin - no sores or suspicious lesions or rashes or color changes  Pertinent labs:  Lab Results  Component Value Date   TSH 1.37 09/09/2021   Lab Results  Component Value Date   WBC 5.7 09/09/2021   HGB 15.3 09/09/2021   HCT 45.2 09/09/2021   MCV 89.8 09/09/2021   PLT 245.0 09/09/2021   Lab Results  Component Value Date   CREATININE 1.10 09/09/2021   BUN 22 09/09/2021   NA 139 09/09/2021   K 4.3 09/09/2021   CL 104 09/09/2021   CO2 25 09/09/2021   Lab Results  Component Value Date   ALT 15 09/09/2021   AST 15 09/09/2021    ALKPHOS 70 09/09/2021   BILITOT 1.2 09/09/2021   Lab Results  Component Value Date   CHOL 204 (H) 09/09/2021   Lab Results  Component Value Date   HDL 54.00 09/09/2021   Lab Results  Component Value Date   LDLCALC 128 (H) 09/09/2021   Lab Results  Component Value Date   TRIG 108.0 09/09/2021   Lab Results  Component Value Date   CHOLHDL 4 09/09/2021   Lab Results  Component Value Date   PSA 2.04  09/09/2021   PSA 2.21 09/08/2020   PSA 2.32 09/09/2019   Lab Results  Component Value Date   HGBA1C 5.2 09/08/2020   ASSESSMENT AND PLAN:   #1 health maintenance exam: Reviewed age and gender appropriate health maintenance issues (prudent diet, regular exercise, health risks of tobacco and excessive alcohol, use of seatbelts, fire alarms in home, use of sunscreen).  Also reviewed age and gender appropriate health screening as well as vaccine recommendations. Vaccines: shingrix->he declines.  Flu->declined..  Tdap is UTD. Labs: HP + PSA Prostate ca screening: PSA today Colon ca screening: recall 2033. Referred to dermatology for skin cancer screening.  2.  Cough. Suspect viral bronchitis that induces some postviral coughing. Mucinex DM twice daily as needed.  #3 erectile dysfunction. Patient has responded well to sildenafil 20 mg, 1-5 tabs daily as needed.  An After Visit Summary was printed and given to the patient.  FOLLOW UP:  Return in about 1 year (around 09/20/2023) for annual CPE (fasting).  Signed:  Santiago Bumpers, MD           09/20/2022

## 2022-10-20 DIAGNOSIS — J209 Acute bronchitis, unspecified: Secondary | ICD-10-CM | POA: Diagnosis not present

## 2022-10-20 DIAGNOSIS — R051 Acute cough: Secondary | ICD-10-CM | POA: Diagnosis not present

## 2022-10-31 ENCOUNTER — Telehealth: Payer: Self-pay | Admitting: Family Medicine

## 2022-10-31 NOTE — Telephone Encounter (Signed)
Prescription Request  10/31/2022  LOV: 09/20/2022 [facility]  What is the name of the medication or equipment? sildenafil (REVATIO) 20 MG tablet   Have you contacted your pharmacy to request a refill? Yes   Which pharmacy would you like this sent to?  [id number] [place], [place] - [address] [place] [address] Phone: [phone] Fax: [phone]  [facility] [place] - [address] [place] [address] Phone: [phone] Fax: [phone]    Patient notified that their request is being sent to the clinical staff for review and that they should receive a response within 2 business days.   Please advise at Mobile [phone] (mobile)

## 2022-10-31 NOTE — Telephone Encounter (Signed)
Pt had last OV 09/20/22 CPE. Last refill of medication requested 2016  sildenafil (REVATIO) 20 MG tablet [[id number]]   Order Details Dose, Route, Frequency: As Directed  Dispense Quantity: 50 tablet Refills: 6        Sig: 2-5 tabs po qd prn: take 30 min prior to intercourse  Patient not taking: Reported on 12/28/2020       Start Date: 05/15/14      Please fill, if appropriate.

## 2022-11-01 MED ORDER — SILDENAFIL CITRATE 20 MG PO TABS: ORAL_TABLET | ORAL | 6 refills | Status: DC

## 2022-11-01 NOTE — Telephone Encounter (Signed)
Ok rx sent.

## 2022-11-07 ENCOUNTER — Telehealth: Payer: Self-pay | Admitting: Family Medicine

## 2022-11-07 NOTE — Telephone Encounter (Signed)
Patient called to inquire about medication sildenafil (REVATIO) 20 MG tablet and report that his insurance is needing a PA now. The [facility] to [facility] is [phone].  He also wanted to inquire if Tadalafil is still possible to be prescribed or which one is the better option. Please give the patient a call to discuss.

## 2022-11-07 NOTE — Telephone Encounter (Signed)
Please complete PA forms and fax back.

## 2022-11-09 ENCOUNTER — Telehealth: Payer: Self-pay

## 2022-11-09 ENCOUNTER — Encounter: Payer: Self-pay | Admitting: Family Medicine

## 2022-11-09 ENCOUNTER — Other Ambulatory Visit (HOSPITAL_COMMUNITY): Payer: Self-pay

## 2022-11-09 NOTE — Telephone Encounter (Signed)
*  Primary  Pharmacy Patient Advocate Encounter   Received notification from Pt Calls Messages that prior authorization for Sildenafil Citrate (PAH) 20MG  tablets  is required/requested.   Insurance verification completed.   The patient is insured through [employer] .   Per test claim: PA required; PA submitted to above mentioned insurance via CoverMyMeds Key/confirmation #/EOC [facility] Status is pending

## 2022-11-09 NOTE — Telephone Encounter (Signed)
Patient called his insurance to find alternative medications , however he was told they would all require a PA. He would like to pursue the appeal process, but unsure how to go about it. The patient is open to alternative medications as well although they will require a PA and would like to have status updates on the process.

## 2022-11-10 NOTE — Telephone Encounter (Signed)
Pharmacy Patient Advocate Encounter  Received notification from [facility] that Prior Authorization for Sildenafil has been DENIED.  Full denial letter will be uploaded to the media tab. See denial reason below.       PA #/Case ID/Reference #: Key: Radene Knee

## 2022-11-10 NOTE — Telephone Encounter (Signed)
Please advise if you faxed completed PA forms

## 2022-11-11 ENCOUNTER — Other Ambulatory Visit (HOSPITAL_COMMUNITY): Payer: Self-pay

## 2022-11-11 ENCOUNTER — Telehealth: Payer: Self-pay | Admitting: Family Medicine

## 2022-11-11 NOTE — Telephone Encounter (Signed)
Jack Kelley has called several times in regards to this medication Sildenafil, however he is now requesting Tadalafil 5 mg be [facility] . He will be Self pay cash (no longer going through insurance because its not covered) Please let the patient know when this has been called in. (He is aware to ask for the Good RX as well)

## 2022-11-11 NOTE — Telephone Encounter (Signed)
Message sent to provider for alternative med request per patient.

## 2022-11-11 NOTE — Telephone Encounter (Signed)
Please advise if medication can be sent. 

## 2022-11-12 MED ORDER — TADALAFIL 5 MG PO TABS: ORAL_TABLET | ORAL | 5 refills | Status: DC

## 2022-11-12 NOTE — Telephone Encounter (Signed)
Okay, tadalafil prescription sent to [facility]

## 2022-11-14 NOTE — Telephone Encounter (Signed)
MyChart message read.

## 2022-12-14 ENCOUNTER — Other Ambulatory Visit (HOSPITAL_COMMUNITY): Payer: Self-pay

## 2022-12-14 ENCOUNTER — Telehealth: Payer: Self-pay | Admitting: Pharmacy Technician

## 2022-12-14 NOTE — Telephone Encounter (Signed)
Pharmacy Patient Advocate Encounter   Received notification from CoverMyMeds that prior authorization for Tadalafil 5MG  tablets is required/requested.   Insurance verification completed.   The patient is insured through [employer] .   Per test claim: PA required; PA submitted to above mentioned insurance via CoverMyMeds Key/confirmation #/EOC [id number] Status is pending

## 2022-12-15 ENCOUNTER — Other Ambulatory Visit (HOSPITAL_COMMUNITY): Payer: Self-pay

## 2022-12-15 NOTE — Telephone Encounter (Signed)
Pharmacy Patient Advocate Encounter  Received notification from [facility] that Prior Authorization for Tadalafil 5MG  tablets  has been DENIED.  Full denial letter will be uploaded to the media tab. See denial reason below.   PA #/Case ID/Reference #: [id number]

## 2022-12-15 NOTE — Telephone Encounter (Signed)
Do think insurance would do the same price for the 10 mg or 20 mg Cialis?

## 2022-12-19 ENCOUNTER — Telehealth: Payer: Self-pay

## 2022-12-19 ENCOUNTER — Other Ambulatory Visit (HOSPITAL_COMMUNITY): Payer: Self-pay

## 2022-12-19 MED ORDER — TADALAFIL 10 MG PO TABS: 10.0000 mg | ORAL_TABLET | ORAL | 5 refills | Status: DC | PRN

## 2022-12-19 NOTE — Telephone Encounter (Signed)
Pharmacy Patient Advocate Encounter   Received notification from Pt Calls Messages that prior authorization for Tadalafil 10MG  tablets is required/requested.   Insurance verification completed.   The patient is insured through [employer] .   Per test claim: PA required and submitted KEY/EOC/Request #: [id number] APPROVED from 11/19/22 to 12/19/23. Ran test claim, Copay is $3.16. This test claim was processed through [facility]- copay amounts may vary at other pharmacies due to pharmacy/plan contracts, or as the patient moves through the different stages of their insurance plan.    [id number]

## 2022-12-19 NOTE — Telephone Encounter (Signed)
Okay, I sent a new prescription for the 10 mg tabs, #8

## 2022-12-19 NOTE — Telephone Encounter (Signed)
PA request for Tadalafil 10mg  has been Approved. New Encounter created for follow up. For additional info see Pharmacy Prior Auth telephone encounter from 12/19/22.

## 2022-12-19 NOTE — Telephone Encounter (Signed)
Ran test claims for Cialis 10mg  and 20mg . Both requires prior authorization.

## 2022-12-21 ENCOUNTER — Telehealth: Payer: Self-pay

## 2022-12-21 NOTE — Telephone Encounter (Unsigned)
Patient

## 2022-12-21 NOTE — Telephone Encounter (Signed)
Yes this is fine.

## 2022-12-21 NOTE — Telephone Encounter (Signed)
Pt has chosen to stay with the 5 mg dose and pay OOP with dis count card. Okay to d/c 10 mg tab and replace with 5 mg tab?

## 2022-12-26 DIAGNOSIS — L821 Other seborrheic keratosis: Secondary | ICD-10-CM | POA: Diagnosis not present

## 2022-12-26 DIAGNOSIS — D225 Melanocytic nevi of trunk: Secondary | ICD-10-CM | POA: Diagnosis not present

## 2022-12-26 DIAGNOSIS — L814 Other melanin hyperpigmentation: Secondary | ICD-10-CM | POA: Diagnosis not present

## 2023-06-15 DIAGNOSIS — D225 Melanocytic nevi of trunk: Secondary | ICD-10-CM | POA: Diagnosis not present

## 2023-06-15 DIAGNOSIS — L739 Follicular disorder, unspecified: Secondary | ICD-10-CM | POA: Diagnosis not present

## 2023-07-26 ENCOUNTER — Other Ambulatory Visit: Payer: Self-pay | Admitting: Family Medicine

## 2023-07-26 NOTE — Telephone Encounter (Unsigned)
 Copied from CRM [id number]. Topic: Clinical - Medication Refill >> Jul 26, 2023  4:54 PM Chiquita SQUIBB wrote: Medication: tadalafil  (CIALIS ) 10 MG tablet [[id number]]  Patient states this should be 5MG    Has the patient contacted their pharmacy? Yes- He needed to call the office (Agent: If no, request that the patient contact the pharmacy for the refill. If patient does not wish to contact the pharmacy document the reason why and proceed with request.) (Agent: If yes, when and what did the pharmacy advise?)  This is the patient's preferred pharmacy:  [id number] [place], [place] - [address] [place] [address] Phone: [phone] Fax: [phone]   Is this the correct pharmacy for this prescription? Yes If no, delete pharmacy and type the correct one.   Has the prescription been filled recently? No  Is the patient out of the medication? Yes  Has the patient been seen for an appointment in the last year OR does the patient have an upcoming appointment? Yes  Can we respond through MyChart? Yes  Agent: Please be advised that Rx refills may take up to 3 business days. We ask that you follow-up with your pharmacy.

## 2023-07-26 NOTE — Telephone Encounter (Signed)
 Patient states RX should be for 5mg , not 10 mg

## 2023-07-27 MED ORDER — TADALAFIL 5 MG PO TABS: ORAL_TABLET | ORAL | 5 refills | Status: AC

## 2023-07-27 NOTE — Telephone Encounter (Signed)
 Please advise if ok for dose change to 5mg . Last OV 09/20/22, f/u scheduled 09/21/23

## 2023-09-21 ENCOUNTER — Ambulatory Visit (INDEPENDENT_AMBULATORY_CARE_PROVIDER_SITE_OTHER): Payer: BC Managed Care – PPO | Admitting: Family Medicine

## 2023-09-21 ENCOUNTER — Encounter: Payer: Self-pay | Admitting: Family Medicine

## 2023-09-21 VITALS — BP 104/69 | HR 77 | Temp 98.3°F | Ht 74.0 in | Wt 190.8 lb

## 2023-09-21 DIAGNOSIS — Z125 Encounter for screening for malignant neoplasm of prostate: Secondary | ICD-10-CM | POA: Diagnosis not present

## 2023-09-21 DIAGNOSIS — Z0000 Encounter for general adult medical examination without abnormal findings: Secondary | ICD-10-CM

## 2023-09-21 LAB — CBC WITH DIFFERENTIAL/PLATELET
Basophils Absolute: 0 K/uL (ref 0.0–0.1)
Basophils Relative: 0.7 % (ref 0.0–3.0)
Eosinophils Absolute: 0.1 K/uL (ref 0.0–0.7)
Eosinophils Relative: 1.1 % (ref 0.0–5.0)
HCT: 45.6 % (ref 39.0–52.0)
Hemoglobin: 15.3 g/dL (ref 13.0–17.0)
Lymphocytes Relative: 37.3 % (ref 12.0–46.0)
Lymphs Abs: 1.9 K/uL (ref 0.7–4.0)
MCHC: 33.5 g/dL (ref 30.0–36.0)
MCV: 88.8 fl (ref 78.0–100.0)
Monocytes Absolute: 0.6 K/uL (ref 0.1–1.0)
Monocytes Relative: 10.8 % (ref 3.0–12.0)
Neutro Abs: 2.6 K/uL (ref 1.4–7.7)
Neutrophils Relative %: 50.1 % (ref 43.0–77.0)
Platelets: 245 K/uL (ref 150.0–400.0)
RBC: 5.14 Mil/uL (ref 4.22–5.81)
RDW: 13.1 % (ref 11.5–15.5)
WBC: 5.2 K/uL (ref 4.0–10.5)

## 2023-09-21 LAB — LIPID PANEL
Cholesterol: 184 mg/dL (ref 0–200)
HDL: 54.7 mg/dL (ref >39.00)
LDL Cholesterol: 110 mg/dL — ABNORMAL HIGH (ref 0–99)
NonHDL: 129.73
Total CHOL/HDL Ratio: 3
Triglycerides: 97 mg/dL (ref 0.0–149.0)
VLDL: 19.4 mg/dL (ref 0.0–40.0)

## 2023-09-21 LAB — PSA: PSA: 4.63 ng/mL — ABNORMAL HIGH (ref 0.10–4.00)

## 2023-09-21 LAB — COMPREHENSIVE METABOLIC PANEL WITH GFR
ALT: 12 U/L (ref 0–53)
AST: 15 U/L (ref 0–37)
Albumin: 4.7 g/dL (ref 3.5–5.2)
Alkaline Phosphatase: 57 U/L (ref 39–117)
BUN: 16 mg/dL (ref 6–23)
CO2: 25 meq/L (ref 19–32)
Calcium: 9.9 mg/dL (ref 8.4–10.5)
Chloride: 104 meq/L (ref 96–112)
Creatinine, Ser: 1.05 mg/dL (ref 0.40–1.50)
GFR: 80.82 mL/min (ref >60.00)
Glucose, Bld: 81 mg/dL (ref 70–99)
Potassium: 4.2 meq/L (ref 3.5–5.1)
Sodium: 138 meq/L (ref 135–145)
Total Bilirubin: 1.2 mg/dL (ref 0.2–1.2)
Total Protein: 7.7 g/dL (ref 6.0–8.3)

## 2023-09-21 LAB — TSH: TSH: 1.85 u[IU]/mL (ref 0.35–5.50)

## 2023-09-21 NOTE — Progress Notes (Signed)
 Office Note 09/21/2023  CC:  Chief Complaint  Patient presents with   Annual Exam    Pt is fasting   HPI:  Patient is a 53 y.o. male who is here for annual health maintenance exam. Feeling well. Healthy diet. Playing soccer. No problems with Cialis . Things going well at [employer].  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  Past Medical History:  Diagnosis Date   Erectile dysfunction    Genital warts    x 1 episode in college--treated and have never recurred.   History of migraine headaches    about 1/month--advil migraine helps abort them   History of rectal bleeding    Rare BRB on toilet tissue; usually after a hard stool.  Only occaasionally    Past Surgical History:  Procedure Laterality Date   APPENDECTOMY  1986   CHEST TUBE INSERTION  1989   spontaneous pneumothorax   COLONOSCOPY     01/2021 NORMAL. recall 2033   INGUINAL HERNIA REPAIR Left 03/2016   with mesh.   TONSILLECTOMY  1970s   WISDOM TOOTH EXTRACTION      Family History  Problem Relation Age of Onset   Hypertension Mother    Cancer Father        prostate   Diabetes Paternal Grandfather    Colon polyps Neg Hx    Colon cancer Neg Hx    Esophageal cancer Neg Hx    Rectal cancer Neg Hx    Stomach cancer Neg Hx     Social History   Socioeconomic History   Marital status: Married    Spouse name: Not on file   Number of children: Not on file   Years of education: Not on file   Highest education level: Not on file  Occupational History   Not on file  Tobacco Use   Smoking status: Never   Smokeless tobacco: Never  Vaping Use   Vaping status: Never Used  Substance and Sexual Activity   Alcohol use: Not Currently    Alcohol/week: 0.0 - 4.0 standard drinks of alcohol    Comment: occasionally   Drug use: No   Sexual activity: Not on file  Other Topics Concern   Not on file  Social History Narrative   Married, 2 children--lives in [place].   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   No tob/alc, only occasional alcohol.   Exercises in warm months: soccer, basketball.   Regular american diet.   Social Drivers of Corporate investment banker Strain: Not on file  Food Insecurity: Not on file  Transportation Needs: Not on file  Physical Activity: Not on file  Stress: Not on file  Social Connections: Not on file  Intimate Partner Violence: Not on file    Outpatient Medications Prior to Visit  Medication Sig Dispense Refill   tadalafil  (CIALIS ) 5 MG tablet 1-2 tabs po every day prn ED 20 tablet 5   No facility-administered medications prior to visit.    Allergies  Allergen Reactions   Aspirin     Pt had a reaction as a child, therefore he decided to never take it     Review of Systems  Constitutional:  Negative for appetite change, chills, fatigue and fever.  HENT:  Negative for congestion, dental problem, ear pain and sore throat.   Eyes:  Negative for discharge, redness and visual disturbance.  Respiratory:  Negative for cough, chest tightness, shortness of breath and wheezing.   Cardiovascular:  Negative for chest pain, palpitations and leg swelling.  Gastrointestinal:  Negative for abdominal pain, blood in stool, diarrhea, nausea and vomiting.  Genitourinary:  Negative for difficulty urinating, dysuria, flank pain, frequency, hematuria and urgency.  Musculoskeletal:  Negative for arthralgias, back pain, joint swelling, myalgias and neck stiffness.  Skin:  Negative for pallor and rash.  Neurological:  Negative for dizziness, speech difficulty, weakness and headaches.  Hematological:  Negative for adenopathy. Does not bruise/bleed easily.  Psychiatric/Behavioral:  Negative for confusion and sleep disturbance. The patient is not nervous/anxious.     PE;    09/21/2023    8:07 AM 09/20/2022    8:05 AM 09/09/2021    8:07 AM  Vitals with BMI  Height 6' 2 6' 2 6' 2  Weight 190 lbs 13 oz 210 lbs 6 oz 206 lbs 3 oz  BMI 24.49 27 26.46   Systolic 104 106 895  Diastolic 69 68 73  Pulse 77 78 75   Gen: Alert, well appearing.  Patient is oriented to person, place, time, and situation. AFFECT: pleasant, lucid thought and speech. ENT: Ears: EACs clear, normal epithelium.  TMs with good light reflex and landmarks bilaterally.  Eyes: no injection, icteris, swelling, or exudate.  EOMI, PERRLA. Nose: no drainage or turbinate edema/swelling.  No injection or focal lesion.  Mouth: lips without lesion/swelling.  Oral mucosa pink and moist.  Dentition intact and without obvious caries or gingival swelling.  Oropharynx without erythema, exudate, or swelling.  Neck: supple/nontender.  No LAD, mass, or TM.  Carotid pulses 2+ bilaterally, without bruits. CV: RRR, no m/r/g.   LUNGS: CTA bilat, nonlabored resps, good aeration in all lung fields. ABD: soft, NT, ND, BS normal.  No hepatospenomegaly or mass.  No bruits. EXT: no clubbing, cyanosis, or edema.  Musculoskeletal: no joint swelling, erythema, warmth, or tenderness.  ROM of all joints intact. Skin - no sores or suspicious lesions or rashes or color changes  Pertinent labs:  Lab Results  Component Value Date   TSH 1.29 09/20/2022   Lab Results  Component Value Date   WBC 5.5 09/20/2022   HGB 14.7 09/20/2022   HCT 44.0 09/20/2022   MCV 89.6 09/20/2022   PLT 243.0 09/20/2022   Lab Results  Component Value Date   CREATININE 0.91 09/20/2022   BUN 15 09/20/2022   NA 140 09/20/2022   K 4.3 09/20/2022   CL 107 09/20/2022   CO2 25 09/20/2022   Lab Results  Component Value Date   ALT 17 09/20/2022   AST 16 09/20/2022   ALKPHOS 67 09/20/2022   BILITOT 0.9 09/20/2022   Lab Results  Component Value Date   CHOL 190 09/20/2022   Lab Results  Component Value Date   HDL 51.80 09/20/2022   Lab Results  Component Value Date   LDLCALC 113 (H) 09/20/2022   Lab Results  Component Value Date   TRIG 128.0 09/20/2022   Lab Results  Component Value Date   CHOLHDL 4  09/20/2022   Lab Results  Component Value Date   PSA 3.65 09/20/2022   PSA 2.04 09/09/2021   PSA 2.21 09/08/2020   Lab Results  Component Value Date   HGBA1C 5.2 09/08/2020   ASSESSMENT AND PLAN:   #1 health maintenance exam: Reviewed age and gender appropriate health maintenance issues (prudent diet, regular exercise, health risks of tobacco and excessive alcohol, use of seatbelts, fire alarms in home, use of sunscreen).  Also reviewed age and gender appropriate health screening  as well as vaccine recommendations. Vaccines: shingrix->he declines.  Flu->declined.  Prevnar 20->declined.  Tdap is UTD. Labs: HP + PSA Prostate ca screening: PSA today Colon ca screening: recall 2033.  #2 erectile dysfunction.  Doing well on Cialis  5 mg, 1-2 tabs daily as needed. No new prescription was needed today.  An After Visit Summary was printed and given to the patient.  FOLLOW UP:  Return in about 1 year (around 09/20/2024) for annual CPE (fasting).  Signed:  Gerlene Hockey, MD           09/21/2023

## 2023-09-22 ENCOUNTER — Ambulatory Visit: Payer: Self-pay | Admitting: Family Medicine

## 2023-09-22 ENCOUNTER — Encounter: Payer: Self-pay | Admitting: Family Medicine

## 2023-09-22 DIAGNOSIS — R972 Elevated prostate specific antigen [PSA]: Secondary | ICD-10-CM

## 2023-09-29 DIAGNOSIS — R972 Elevated prostate specific antigen [PSA]: Secondary | ICD-10-CM | POA: Diagnosis not present

## 2023-10-03 ENCOUNTER — Encounter: Payer: Self-pay | Admitting: Urology

## 2023-10-03 ENCOUNTER — Other Ambulatory Visit: Payer: Self-pay | Admitting: Urology

## 2023-10-03 DIAGNOSIS — R972 Elevated prostate specific antigen [PSA]: Secondary | ICD-10-CM

## 2023-10-22 ENCOUNTER — Ambulatory Visit
Admission: RE | Admit: 2023-10-22 | Discharge: 2023-10-22 | Disposition: A | Discharge status: Home / Self Care | Source: Ambulatory Visit | Attending: Urology | Admitting: Urology

## 2023-10-22 DIAGNOSIS — R972 Elevated prostate specific antigen [PSA]: Secondary | ICD-10-CM | POA: Diagnosis not present

## 2023-10-22 MED ORDER — GADOPICLENOL 0.5 MMOL/ML IV SOLN
9.0000 mL | Freq: Once | INTRAVENOUS | Status: AC | PRN
  Administered 2023-10-22: 9 mL via INTRAVENOUS

## 2023-11-08 DIAGNOSIS — N400 Enlarged prostate without lower urinary tract symptoms: Secondary | ICD-10-CM | POA: Diagnosis not present

## 2023-11-08 DIAGNOSIS — R972 Elevated prostate specific antigen [PSA]: Secondary | ICD-10-CM | POA: Diagnosis not present

## 2023-12-25 DIAGNOSIS — D1801 Hemangioma of skin and subcutaneous tissue: Secondary | ICD-10-CM | POA: Diagnosis not present

## 2023-12-25 DIAGNOSIS — L814 Other melanin hyperpigmentation: Secondary | ICD-10-CM | POA: Diagnosis not present

## 2023-12-25 DIAGNOSIS — L821 Other seborrheic keratosis: Secondary | ICD-10-CM | POA: Diagnosis not present

## 2023-12-29 ENCOUNTER — Encounter: Payer: Self-pay | Admitting: Sports Medicine

## 2023-12-29 ENCOUNTER — Ambulatory Visit: Admitting: Sports Medicine

## 2023-12-29 ENCOUNTER — Ambulatory Visit: Payer: Self-pay

## 2023-12-29 VITALS — BP 115/80 | HR 98 | Temp 97.7°F | Wt 187.0 lb

## 2023-12-29 DIAGNOSIS — F411 Generalized anxiety disorder: Secondary | ICD-10-CM | POA: Diagnosis not present

## 2023-12-29 DIAGNOSIS — R972 Elevated prostate specific antigen [PSA]: Secondary | ICD-10-CM | POA: Diagnosis not present

## 2023-12-29 MED ORDER — SERTRALINE HCL 50 MG PO TABS: 50.0000 mg | ORAL_TABLET | Freq: Every day | ORAL | 0 refills | Status: DC

## 2023-12-29 NOTE — Progress Notes (Unsigned)
 "  Careteam: Patient Care Team: [name], MD as PCP - General (Family Medicine) Gail Favorite, MD as Consulting Physician (General Surgery) Livingston Rigg, MD as Consulting Physician (Dermatology)  Allergies[1]  Chief Complaint  Patient presents with   Anxiety    Pt has been having increased anxiety and is having trouble functioning.    Discussed the use of AI scribe software for clinical note transcription with the patient, who gave verbal consent to proceed.  History of Present Illness      Review of Systems:  Review of Systems  Constitutional:  Negative for chills and fever.  HENT:  Negative for congestion and sore throat.   Eyes:  Negative for blurred vision.  Respiratory:  Negative for cough, sputum production and shortness of breath.   Cardiovascular:  Negative for chest pain, palpitations and leg swelling.  Gastrointestinal:  Negative for abdominal pain, heartburn and nausea.  Genitourinary:  Negative for dysuria, frequency and hematuria.  Musculoskeletal:  Negative for falls and myalgias.  Neurological:  Negative for dizziness, sensory change and focal weakness.   Negative unless indicated in HPI.   Patient Active Problem List   Diagnosis Date Noted   Health maintenance examination 04/06/2012   Past Medical History:  Diagnosis Date   Elevated PSA    09/2023, mild elev but increased PSA velocity as well--> urology referral   Erectile dysfunction    Genital warts    x 1 episode in college--treated and have never recurred.   History of migraine headaches    about 1/month--advil migraine helps abort them   History of rectal bleeding    Rare BRB on toilet tissue; usually after a hard stool.  Only occaasionally   Past Surgical History:  Procedure Laterality Date   APPENDECTOMY  1986   CHEST TUBE INSERTION  1989   spontaneous pneumothorax   COLONOSCOPY     01/2021 NORMAL. recall 2033   INGUINAL HERNIA REPAIR Left 03/2016   with mesh.    TONSILLECTOMY  1970s   WISDOM TOOTH EXTRACTION     Social History[2] Family History  Problem Relation Age of Onset   Hypertension Mother    Cancer Father        prostate   Diabetes Paternal Grandfather    Colon polyps Neg Hx    Colon cancer Neg Hx    Esophageal cancer Neg Hx    Rectal cancer Neg Hx    Stomach cancer Neg Hx    Allergies[3]  Medications: Patient's Medications  New Prescriptions   SERTRALINE (ZOLOFT) 50 MG TABLET    Take 1 tablet (50 mg total) by mouth daily.  Previous Medications   TADALAFIL  (CIALIS ) 5 MG TABLET    1-2 tabs po every day prn ED   TAMSULOSIN (FLOMAX) 0.4 MG CAPS CAPSULE    Take 0.4 mg by mouth at bedtime.  Modified Medications   No medications on file  Discontinued Medications   No medications on file    Physical Exam: Vitals:   12/29/23 1448  BP: 115/80  Pulse: 98  Temp: 97.7 F (36.5 C)  TempSrc: Oral  SpO2: 99%  Weight: 187 lb (84.8 kg)   Body mass index is 24.01 kg/m. BP Readings from Last 3 Encounters:  12/29/23 115/80  09/21/23 104/69  09/20/22 106/68   Wt Readings from Last 3 Encounters:  12/29/23 187 lb (84.8 kg)  09/21/23 190 lb 12.8 oz (86.5 kg)  09/20/22 210 lb 6.4 oz (95.4 kg)    Physical Exam Constitutional:  Appearance: Normal appearance.  HENT:     Head: Normocephalic and atraumatic.  Cardiovascular:     Rate and Rhythm: Normal rate and regular rhythm.     Pulses: Normal pulses.     Heart sounds: Normal heart sounds.  Pulmonary:     Effort: No respiratory distress.     Breath sounds: No stridor. No wheezing or rales.  Abdominal:     General: Bowel sounds are normal. There is no distension.     Palpations: Abdomen is soft.     Tenderness: There is no abdominal tenderness. There is no guarding.  Musculoskeletal:        General: No swelling.  Neurological:     Mental Status: He is alert. Mental status is at baseline.     Sensory: No sensory deficit.     Motor: No weakness.     Labs  reviewed: Basic Metabolic Panel: Recent Labs    09/21/23 0815  NA 138  K 4.2  CL 104  CO2 25  GLUCOSE 81  BUN 16  CREATININE 1.05  CALCIUM 9.9  TSH 1.85   Liver Function Tests: Recent Labs    09/21/23 0815  AST 15  ALT 12  ALKPHOS 57  BILITOT 1.2  PROT 7.7  ALBUMIN 4.7   No results for input(s): LIPASE, AMYLASE in the last 8760 hours. No results for input(s): AMMONIA in the last 8760 hours. CBC: Recent Labs    09/21/23 0815  WBC 5.2  NEUTROABS 2.6  HGB 15.3  HCT 45.6  MCV 88.8  PLT 245.0   Lipid Panel: Recent Labs    09/21/23 0815  CHOL 184  HDL 54.70  LDLCALC 110*  TRIG 97.0  CHOLHDL 3   TSH: Recent Labs    09/21/23 0815  TSH 1.85   A1C: Lab Results  Component Value Date   HGBA1C 5.2 09/08/2020    Assessment & Plan GAD (generalized anxiety disorder)  Orders:   sertraline (ZOLOFT) 50 MG tablet; Take 1 tablet (50 mg total) by mouth daily.   Ambulatory referral to Psychology  Elevated PSA  Orders:   Ambulatory referral to Psychology   No follow-ups on file.:   Laiya Wisby     [1]  Allergies Allergen Reactions   Aspirin     Pt had a reaction as a child, therefore he decided to never take it   [2]  Social History Tobacco Use   Smoking status: Never   Smokeless tobacco: Never  Vaping Use   Vaping status: Never Used  Substance Use Topics   Alcohol use: Not Currently    Alcohol/week: 0.0 - 4.0 standard drinks of alcohol    Comment: occasionally   Drug use: No  [3]  Allergies Allergen Reactions   Aspirin     Pt had a reaction as a child, therefore he decided to never take it    "

## 2023-12-29 NOTE — Telephone Encounter (Signed)
 FYI Only or Action Required?: FYI only for provider: appointment scheduled on 12/29/23.  Patient was last seen in primary care on 09/21/2023 by McGowen, Aleene DEL, MD.  Called Nurse Triage reporting Anxiety.  Symptoms began several weeks ago.  Interventions attempted: Rest, hydration, or home remedies.  Symptoms are: gradually worsening.  Triage Disposition: See PCP When Office is Open (Within 3 Days)  Patient/caregiver understands and will follow disposition?:    Copied from CRM [id number]. Topic: Clinical - Red Word Triage >> Dec 29, 2023 10:54 AM Alfonso ORN wrote: Red Word that prompted transfer to Nurse Triage: extreme anxiety Reason for Disposition  [1] Anxiety symptoms AND [2] has not been evaluated for this by doctor (or NP/PA)  Answer Assessment - Initial Assessment Questions Pt called in requesting appt for anxiety and running thoughts. Pt reports elevated PSA in August which has lead to anxiety d/t results still not being finalized at this time. Pt states that he had an MRI in October and was cleared that he would not require surgery. States that he was told f/u would be in 2 months to determine if POC would need to be aggressive tx. Pt states since appt his mind has ran and every ache or pain increases his anxiety. Pt states he is now 2 weeks away from f/u appt and the anticipation is making anxiety worse. Pt has been trying relaxation techniques but it is no longer effective at this time. Discussed pt does have a support system but only one other person knows about potential dx r/t elevated PSA. Discussed appt with PCP 12/22 or alternative provider today, pt elects for today d/t panicking. Appointment scheduled for evaluation. Patient agrees with plan of care, and will call back if anything changes, or if symptoms worsen.      1. CONCERN: Did anything happen that prompted you to call today?      Anxiety is getting worse; pt dx with elevated PSA in August, cleared via MRI in  October, 2 weeks from next appt and anticipation is making anxiety worse  2. ANXIETY SYMPTOMS: Can you describe how you (your loved one; patient) have been feeling? (e.g., tense, restless, panicky, anxious, keyed up, overwhelmed, sense of impending doom).      Panicky, anxious; pt reports his mind is running with thoughts d/t recent medical dx   3. ONSET: How long have you been feeling this way? (e.g., hours, days, weeks)     Since August   4. SEVERITY: How would you rate the level of anxiety? (e.g., 0 - 10; or mild, moderate, severe).     Severe  5. FUNCTIONAL IMPAIRMENT: How have these feelings affected your ability to do daily activities? Have you had more difficulty than usual doing your normal daily activities? (e.g., getting better, same, worse; self-care, school, work, interactions)     Pt reports still doing his day to day but that his mind and thought run all day  6. HISTORY: Have you felt this way before? Have you ever been diagnosed with an anxiety problem in the past? (e.g., generalized anxiety disorder, panic attacks, PTSD). If Yes, ask: How was this problem treated? (e.g., medicines, counseling, etc.)     No        8. TREATMENT:  What has been done so far to treat this anxiety? (e.g., medicines, relaxation strategies). What has helped?     Relaxation   9. THERAPIST: Do you have a counselor or therapist? If Yes, ask: What is their name?  No   11. PATIENT SUPPORT: Who is with you now? Who do you live with? Do you have family or friends who you can talk to?        Pt has supportive family; states only one other person knows about medical dx as he does not want to worry his family until results are finalized   12. OTHER SYMPTOMS: Do you have any other symptoms? (e.g., feeling depressed, trouble concentrating, trouble sleeping, trouble breathing, palpitations or fast heartbeat, chest pain, sweating, nausea, or diarrhea)       None  Protocols  used: Anxiety and Panic Attack-A-AH

## 2023-12-29 NOTE — Telephone Encounter (Signed)
 FYI  Please see below

## 2023-12-29 NOTE — Telephone Encounter (Signed)
 noted

## 2024-01-24 ENCOUNTER — Ambulatory Visit: Admitting: Sports Medicine

## 2024-01-24 ENCOUNTER — Ambulatory Visit: Payer: Self-pay

## 2024-01-24 ENCOUNTER — Encounter: Payer: Self-pay | Admitting: Sports Medicine

## 2024-01-24 VITALS — BP 121/85 | HR 103 | Temp 98.6°F | Wt 192.4 lb

## 2024-01-24 DIAGNOSIS — M545 Low back pain, unspecified: Secondary | ICD-10-CM | POA: Diagnosis not present

## 2024-01-24 DIAGNOSIS — F411 Generalized anxiety disorder: Secondary | ICD-10-CM

## 2024-01-24 MED ORDER — SERTRALINE HCL 25 MG PO TABS: 25.0000 mg | ORAL_TABLET | Freq: Every day | ORAL | 0 refills | Status: DC

## 2024-01-24 NOTE — Telephone Encounter (Signed)
 FYI Only or Action Required?: Action required by provider: request for appointment.  Patient was last seen in primary care on 12/29/2023 by Sherlynn Madden, MD.  Called Nurse Triage reporting Back Pain.  Symptoms began a week ago.  Interventions attempted: Prescription medications: muscle relaxer.  Symptoms are: stable.Has low back pain with tingling in arms that comes and goes.  Triage Disposition: See PCP When Office is Open (Within 3 Days)  Patient/caregiver understands and will follow disposition?: Yes      Copied from CRM [id number]. Topic: Clinical - Red Word Triage >> Jan 24, 2024 11:16 AM Suzen RAMAN wrote: Red Word that prompted transfer to Nurse Triage: lower back pain, causing numbness &  tingling in arms Answer Assessment - Initial Assessment Questions 1. ONSET: When did the pain begin? (e.g., minutes, hours, days)     1 week 2. LOCATION: Where does it hurt? (upper, mid or lower back)     lower 3. SEVERITY: How bad is the pain?  (e.g., Scale 1-10; mild, moderate, or severe)     Now - 4 4. PATTERN: Is the pain constant? (e.g., yes, no; constant, intermittent)      Comes and goes 5. RADIATION: Does the pain shoot into your legs or somewhere else?     no 6. CAUSE:  What do you think is causing the back pain?      unsure 7. BACK OVERUSE:  Any recent lifting of heavy objects, strenuous work or exercise?     no 8. MEDICINES: What have you taken so far for the pain? (e.g., nothing, acetaminophen, NSAIDS)     Muscle relaxer 9. NEUROLOGIC SYMPTOMS: Do you have any weakness, numbness, or problems with bowel/bladder control?     no 10. OTHER SYMPTOMS: Do you have any other symptoms? (e.g., fever, abdomen pain, burning with urination, blood in urine)       Tingling in arms 11. PREGNANCY: Is there any chance you are pregnant? When was your last menstrual period?       N/a  Protocols used: Back Pain-A-AH  Reason for Disposition  [1] MODERATE  back pain (e.g., interferes with normal activities) AND [2] present > 3 days  Answer Assessment - Initial Assessment Questions 1. ONSET: When did the pain begin? (e.g., minutes, hours, days)     1 week 2. LOCATION: Where does it hurt? (upper, mid or lower back)     lower 3. SEVERITY: How bad is the pain?  (e.g., Scale 1-10; mild, moderate, or severe)     Now - 4 4. PATTERN: Is the pain constant? (e.g., yes, no; constant, intermittent)      Comes and goes 5. RADIATION: Does the pain shoot into your legs or somewhere else?     no 6. CAUSE:  What do you think is causing the back pain?      unsure 7. BACK OVERUSE:  Any recent lifting of heavy objects, strenuous work or exercise?     no 8. MEDICINES: What have you taken so far for the pain? (e.g., nothing, acetaminophen, NSAIDS)     Muscle relaxer 9. NEUROLOGIC SYMPTOMS: Do you have any weakness, numbness, or problems with bowel/bladder control?     no 10. OTHER SYMPTOMS: Do you have any other symptoms? (e.g., fever, abdomen pain, burning with urination, blood in urine)       Tingling in arms 11. PREGNANCY: Is there any chance you are pregnant? When was your last menstrual period?       N/a  Protocols  used: Back Pain-A-AH

## 2024-01-24 NOTE — Telephone Encounter (Signed)
 FYI reviewed

## 2024-01-24 NOTE — Progress Notes (Signed)
 "  Careteam: Patient Care Team: Jack Aleene DEL, MD as PCP - General (Family Medicine) Jack Favorite, MD as Consulting Physician (General Surgery) Jack Rigg, MD as Consulting Physician (Dermatology)  Allergies[1]  Chief Complaint  Patient presents with   Back Pain    Pt has been having lower back pain for a week now. He denies urinary symptoms.    Discussed the use of AI scribe software for clinical note transcription with the patient, who gave verbal consent to proceed.  History of Present Illness    Jack Kelley. is a 54 year old male who presents with low back pain.  He has been experiencing persistent back pain for the past ten days, which began with a 'twinge'. The pain is constant, rated as a 3-4 out of 10, and does not radiate to his legs. It occasionally subsides with exercise but remains bothersome during activities such as walking. He has been using a heating pad on his back for the past week and has noticed a sensation of heat in his thighs.  Additionally, he has experienced tingling in his arms upon waking for the past three nights, with his hands feeling swollen in the morning, though this improves with movement. No fever, night sweats, changes in appetite, weight loss, urinary symptoms, or tingling/numbness in the legs. 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  He recently consulted his urologist, who told him that his 4K score was 5.1 and his PSA was 4.3, and that he did not think the symptoms were related to prostate cancer. He was prescribed a muscle relaxant, which he takes six times a day, and reports that it has improved his pain. He also takes ibuprofen, 400 mg, three times a day.  He has a history of anxiety, which has been exacerbated by his back pain. His anxiety was improving until the onset of his back pain, which has caused increased worry. He is currently taking Zoloft  50 mg daily for anxiety.  Review of  Systems:  Review of Systems  Constitutional:  Negative for chills, fever and malaise/fatigue.  HENT:  Negative for congestion and sore throat.   Eyes:  Negative for blurred vision.  Respiratory:  Negative for cough, sputum production and shortness of breath.   Cardiovascular:  Negative for chest pain, palpitations and leg swelling.  Gastrointestinal:  Negative for abdominal pain, heartburn and nausea.  Genitourinary:  Negative for dysuria, frequency and hematuria.  Musculoskeletal:  Positive for back pain. Negative for falls and myalgias.  Neurological:  Negative for dizziness, sensory change and focal weakness.   Negative unless indicated in HPI.   Patient Active Problem List   Diagnosis Date Noted   Health maintenance examination 04/06/2012   Past Medical History:  Diagnosis Date   Elevated PSA    09/2023, mild elev but increased PSA velocity as well--> urology referral   Erectile dysfunction    Genital warts    x 1 episode in college--treated and have never recurred.   History of migraine headaches    about 1/month--advil migraine helps abort them   History of rectal bleeding    Rare BRB on toilet tissue; usually after a hard stool.  Only occaasionally   Past Surgical History:  Procedure Laterality Date   APPENDECTOMY  1986   CHEST TUBE INSERTION  1989   spontaneous pneumothorax   COLONOSCOPY     01/2021 NORMAL. recall 2033   INGUINAL HERNIA REPAIR Left 03/2016   with  mesh.   TONSILLECTOMY  1970s   WISDOM TOOTH EXTRACTION     Social History[2] Family History  Problem Relation Age of Onset   Hypertension Mother    Cancer Father        prostate   Diabetes Paternal Grandfather    Colon polyps Neg Hx    Colon cancer Neg Hx    Esophageal cancer Neg Hx    Rectal cancer Neg Hx    Stomach cancer Neg Hx    Allergies[3]  Medications: Patient's Medications  New Prescriptions   SERTRALINE  (ZOLOFT ) 25 MG TABLET    Take 1 tablet (25 mg total) by mouth daily.  Previous  Medications   METHOCARBAMOL (ROBAXIN) 750 MG TABLET    Take by mouth.   SERTRALINE  (ZOLOFT ) 50 MG TABLET    Take 1 tablet (50 mg total) by mouth daily.   TADALAFIL  (CIALIS ) 5 MG TABLET    1-2 tabs po every day prn ED   TAMSULOSIN (FLOMAX) 0.4 MG CAPS CAPSULE    Take 0.4 mg by mouth at bedtime.  Modified Medications   No medications on file  Discontinued Medications   No medications on file    Physical Exam: Vitals:   01/24/24 1303  BP: 121/85  Pulse: (!) 103  Temp: 98.6 F (37 C)  TempSrc: Oral  SpO2: 98%  Weight: 192 lb 6.4 oz (87.3 kg)   Body mass index is 24.7 kg/m. BP Readings from Last 3 Encounters:  01/24/24 121/85  12/29/23 115/80  09/21/23 104/69   Wt Readings from Last 3 Encounters:  01/24/24 192 lb 6.4 oz (87.3 kg)  12/29/23 187 lb (84.8 kg)  09/21/23 190 lb 12.8 oz (86.5 kg)    Physical Exam Constitutional:      Appearance: Normal appearance.  HENT:     Head: Normocephalic and atraumatic.  Cardiovascular:     Rate and Rhythm: Normal rate and regular rhythm.     Pulses: Normal pulses.     Heart sounds: Normal heart sounds.  Pulmonary:     Effort: No respiratory distress.     Breath sounds: No stridor. No wheezing or rales.  Abdominal:     General: Bowel sounds are normal. There is no distension.     Palpations: Abdomen is soft.     Tenderness: There is no abdominal tenderness. There is no guarding.  Musculoskeletal:        General: No swelling.     Comments: No point tenderness SLR negative Strength and sensations intact  Neurological:     Mental Status: He is alert. Mental status is at baseline.     Sensory: No sensory deficit.     Motor: No weakness.     Labs reviewed: Basic Metabolic Panel: Recent Labs    09/21/23 0815  NA 138  K 4.2  CL 104  CO2 25  GLUCOSE 81  BUN 16  CREATININE 1.05  CALCIUM 9.9  TSH 1.85   Liver Function Tests: Recent Labs    09/21/23 0815  AST 15  ALT 12  ALKPHOS 57  BILITOT 1.2  PROT 7.7   ALBUMIN 4.7   No results for input(s): LIPASE, AMYLASE in the last 8760 hours. No results for input(s): AMMONIA in the last 8760 hours. CBC: Recent Labs    09/21/23 0815  WBC 5.2  NEUTROABS 2.6  HGB 15.3  HCT 45.6  MCV 88.8  PLT 245.0   Lipid Panel: Recent Labs    09/21/23 0815  CHOL 184  HDL  54.70  LDLCALC 110*  TRIG 97.0  CHOLHDL 3   TSH: Recent Labs    09/21/23 0815  TSH 1.85   A1C: Lab Results  Component Value Date   HGBA1C 5.2 09/08/2020    Assessment & Plan Acute bilateral low back pain without sciatica Low back pain since 1 week No point tenderness SLR negative ROM intact  Reports 3/10 pain  Cont with muscle relaxant and nsaids Follow up in 2 weeks with PCP    GAD (generalized anxiety disorder) Pt states his mood is improving but since 1 week he started feeling anxious related to back pain  Will increase zoloft  to 75 mg daily  Follow up in 2 weeks with PCP Orders:   sertraline  (ZOLOFT ) 25 MG tablet; Take 1 tablet (25 mg total) by mouth daily.   Return in about 2 weeks (around 02/07/2024).:   Maimuna Leaman     [1]  Allergies Allergen Reactions   Aspirin     Pt had a reaction as a child, therefore he decided to never take it   [2]  Social History Tobacco Use   Smoking status: Never   Smokeless tobacco: Never  Vaping Use   Vaping status: Never Used  Substance Use Topics   Alcohol use: Not Currently    Alcohol/week: 0.0 - 4.0 standard drinks of alcohol    Comment: occasionally   Drug use: No  [3]  Allergies Allergen Reactions   Aspirin     Pt had a reaction as a child, therefore he decided to never take it    "

## 2024-01-26 ENCOUNTER — Ambulatory Visit: Admitting: Family Medicine

## 2024-02-05 ENCOUNTER — Ambulatory Visit: Admitting: Psychology

## 2024-02-05 DIAGNOSIS — F4322 Adjustment disorder with anxiety: Secondary | ICD-10-CM

## 2024-02-05 NOTE — Progress Notes (Addendum)
 "                                                        Main Office Phone: [phone] Fax: [phone]   Date: February 05, 2024    Name: Jack Kelley. MRN: [id number] Appointment Start Time: 4:00pm Duration: 57 minutes Provider: Wyatt Fire, Psy.D. Type of Session: Intake for Individual Therapy  Location of Patient: Home Location of Provider: Provider's Home Office (private) Type of Contact: Telepsychological Visit via Caregility (video & audio capabilities)  Informed Consent: Prior to proceeding with today's appointment, two pieces of identifying information were obtained. In addition, [name]'s physical location at the time of this appointment was obtained as well a phone number he could be reached at in the event of technical difficulties. Jack Kelley and this provider participated in today's telepsychological service.   The provider's role was explained to [employer].. The provider reviewed and discussed issues of confidentiality, privacy, and limits therein (e.g., reporting obligations). In addition to verbal informed consent, written informed consent for psychological services was obtained prior to the initial appointment. Since the clinic is not a 24/7 crisis center, mental health emergency resources were discussed and this provider explained MyChart, e-mail, voicemail, and/or other messaging systems should be utilized only for non-emergency reasons. This provider also explained that information obtained during appointments will be securely placed in Raji's medical record. He is aware he is responsible for securing confidentiality on his end of the session. Jack Kelley verbally consented to proceed. All questions/concerns related to new patient paperwork were addressed.   Of note, today's appointment was switched to a regular telephone call at 4:24pm with Pace's verbal consent due to technical issues.   Chief Complaint/HPI: [name]'s new patient paperwork indicated a desire to  address anxiety over possible health issues.  During today's appointment, Jack Kelley reported his PCP recommended he meet with this provider. He shared in September 2025 he had an elevated PSA level, which is related to prostate cancer. He noted undergoing additional medical work up and resulted indicated he likely does not have cancer; however, results were not definitive. He stated he was advised to wait six months to re-test, adding the option of having a biopsy was discussed per his request. He is currently scheduled for a biopsy for next Friday; however, he indicated he is unsure if he will keep the appointment or cancel it. When he scheduled this appointment, he had not received the aforementioned information and he was experiencing extreme anxiety resulting in this referral and a prescription for Zoloft . He stated the anxiety also contributed to physical pain. While Zoloft  has helped reduced symptoms of anxiety, Jack Kelley continues to describe some worry. He indicated his girlfriend is aware of his current medical status; however, he has not informed anyone else. Notably, Jack Kelley stated this is the first time he experienced anxiety to this extent. He recalled experiencing the following anxiety-related symptoms prior to the Zoloft  rx: difficulty relaxing, difficulty focusing at work, difficulty accomplishing tasks, difficulty processing other things, social withdrawal, feeling tense, and racing thoughts. [name] explained he opted to keep this appointment in the event he receives unpleasant new regarding his health.   Mental Status Examination:  Appearance: neat Behavior: appropriate to circumstances Mood: neutral Affect: mood congruent Speech: WNL Eye Contact: appropriate Psychomotor Activity: WNL Gait: unable to  assess Thought Process: linear, logical, and goal directed and denies suicidal, homicidal, and self-harm ideation, plan and intent  Thought Content/Perception: no hallucinations,  delusions, bizarre thinking or behavior endorsed or observed Orientation: AAOx4 Memory/Concentration: intact Insight/Judgment: fair  Family & Psychosocial History: Jack Kelley reported he is in a relationship and he has two sons (ages 87 and 49). He indicated he is currently employed with [employer] as a merchant navy officer. Additionally, Jack Kelley shared his highest level of education obtained is a bachelor's degree. Jack Kelley stated he resides with his youngest son (50/50 custody). Currently, Jack Kelley's social support system consists of his girlfriend, children, brother, father, and friends. Moreover, he indicated that he was raised [name] and does not currently attend church. 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   Medical History: Jack Kelley reported his medical history is significant for the following: elevated PSA. He indicated prior appendix removal. Currently, he is taking Zoloft , Flomax and robaxin .  Per chart review:  Past Medical History:  Diagnosis Date   Elevated PSA    09/2023, mild elev but increased PSA velocity as well--> urology referral   Erectile dysfunction    Genital warts    x 1 episode in college--treated and have never recurred.   History of migraine headaches    about 1/month--advil migraine helps abort them   History of rectal bleeding    Rare BRB on toilet tissue; usually after a hard stool.  Only occaasionally    Past Surgical History:  Procedure Laterality Date   APPENDECTOMY  1986   CHEST TUBE INSERTION  1989   spontaneous pneumothorax   COLONOSCOPY     01/2021 NORMAL. recall 2033   INGUINAL HERNIA REPAIR Left 03/2016   with mesh.   TONSILLECTOMY  1970s   WISDOM TOOTH EXTRACTION      Current Outpatient Medications  Medication Sig Dispense Refill   methocarbamol  (ROBAXIN ) 750 MG tablet Take by mouth.     sertraline  (ZOLOFT ) 25 MG tablet Take 1 tablet (25 mg total) by mouth daily. 90 tablet 0    sertraline  (ZOLOFT ) 50 MG tablet Take 1 tablet (50 mg total) by mouth daily. 90 tablet 0   tadalafil  (CIALIS ) 5 MG tablet 1-2 tabs po every day prn ED 20 tablet 5   tamsulosin (FLOMAX) 0.4 MG CAPS capsule Take 0.4 mg by mouth at bedtime.     No current facility-administered medications for this visit.    Allergies[1]  Mental Health History: Cordell reported he talked to someone many years ago when going through the divorce process. He is not currently meeting with a psychiatric provider. Jeffrie reported there is no history of hospitalizations for psychiatric concerns. Seddrick denied a family history of mental health/substance abuse related concerns. Furthermore, Hurman reported there is no history of trauma including psychological, physical , and sexual abuse, as well as neglect.   Mallory endorsed current alcohol use (one standard drink every day or one standard drink every couple days). He denied tobacco use. He denied illicit/recreational substance use. He endorsed the following caffeine use: 1-2 cans of soda and 16 oz of coffee daily. Furthermore, Jack Kelley indicated he is not experiencing the following: hallucinations and delusions, paranoia, symptoms of mania , panic attacks, memory concerns, attention and concentration issues, and obsessions and compulsions. He also denied history of and current suicidal ideation, plan, and intent; history of and current homicidal ideation, plan, and intent; and history of and current engagement in self-harm. While future psychiatric events  cannot be accurately predicted, the patient does not currently require acute inpatient psychiatric care and does not currently meet Allen  involuntary commitment criteria.  Sahir endorsed the following coping strategies: deep breathing, taking Zoloft , talking to girlfriend, and walking. Davian and this provider identified the following strengths: supportive relationships (e.g., girlfriend), pay attention to detail, and  logical.The following barriers were observed/identified: none.   Legal History: Edvardo reported there is no history of legal involvement.   Structured Assessments Results: The Patient Health Questionnaire-9 (PHQ-9) is a self-report measure that assesses symptoms and severity of depression over the course of the last two weeks. Doyle obtained a score of 2 suggesting minimal depression. Airon finds the endorsed symptoms to be not difficult at all. [0= Not at all; 1= Several days; 2= More than half the days; 3= Nearly every day] Little interest or pleasure in doing things 0  Feeling down, depressed, or hopeless 0  Trouble falling or staying asleep, or sleeping too much 2  Feeling tired or having little energy 0  Poor appetite or overeating 0  Feeling bad about yourself --- or that you are a failure or have let yourself or your family down 0  Trouble concentrating on things, such as reading the newspaper or watching television 0  Moving or speaking so slowly that other people could have noticed? Or the opposite --- being so fidgety or restless that you have been moving around a lot more than usual 0  Thoughts that you would be better off dead or hurting yourself in some way 0  PHQ-9 Score 2    The Generalized Anxiety Disorder-7 (GAD-7) is a brief self-report measure that assesses symptoms of anxiety over the course of the last two weeks. Thaddus obtained a score of 0. [0= Not at all; 1= Several days; 2= Over half the days; 3= Nearly every day] Feeling nervous, anxious, on edge 0  Not being able to stop or control worrying 0  Worrying too much about different things 0  Trouble relaxing 0  Being so restless that it's hard to sit still 0  Becoming easily annoyed or irritable 0  Feeling afraid as if something awful might happen 0  GAD-7 Score 0   Interventions:  Conducted a chart review Focused on rapport building Verbally administered PHQ-9 and GAD-7 for symptom monitoring Provided  emphatic reflections and validation Provided positive reinforcement Conducted a clinical interview  Provisional DSM-5 Diagnosis(es): Llewellyn reported experiencing anxiety-related symptoms secondary to PSA elevations, which included the following: difficulty relaxing, difficulty focusing at work, difficulty accomplishing tasks, difficulty processing other things, social withdrawal, feeling tense, and racing thoughts. He indicated he is currently taking Zoloft , which has significantly reduced anxiety symptomatology. He denied experiencing the aforementioned symptoms in the past. Based on the aforementioned, the following was assigned: F43.22 Adjustment Disorder with Anxiety.  Plan: Hiran appears able and willing to participate as evidenced engagement in reciprocal conversation and asking questions as needed for clarification. The next appointment is scheduled for 02/20/2024 at 4pm, which will be via Caregility. For homework, Anup was encouraged to reflect on goal(s) for treatment, as a treatment plan will be developed at the next appointment. This provider will regularly review the treatment plan and medical chart to keep informed of status changes.    Wyatt SHAUNNA Fire, PsyD     [1]  Allergies Allergen Reactions   Aspirin     Pt had a reaction as a child, therefore he decided to never take it    "

## 2024-02-07 ENCOUNTER — Ambulatory Visit: Admitting: Family Medicine

## 2024-02-07 ENCOUNTER — Encounter: Payer: Self-pay | Admitting: Family Medicine

## 2024-02-07 VITALS — BP 121/78 | HR 89 | Temp 97.8°F | Ht 74.0 in | Wt 194.6 lb

## 2024-02-07 DIAGNOSIS — F411 Generalized anxiety disorder: Secondary | ICD-10-CM | POA: Diagnosis not present

## 2024-02-07 DIAGNOSIS — M545 Low back pain, unspecified: Secondary | ICD-10-CM | POA: Diagnosis not present

## 2024-02-07 DIAGNOSIS — R972 Elevated prostate specific antigen [PSA]: Secondary | ICD-10-CM

## 2024-02-07 DIAGNOSIS — F4323 Adjustment disorder with mixed anxiety and depressed mood: Secondary | ICD-10-CM | POA: Diagnosis not present

## 2024-02-07 MED ORDER — METHOCARBAMOL 750 MG PO TABS: 750.0000 mg | ORAL_TABLET | Freq: Three times a day (TID) | ORAL | 1 refills | Status: AC | PRN

## 2024-02-07 MED ORDER — SERTRALINE HCL 50 MG PO TABS: 50.0000 mg | ORAL_TABLET | Freq: Every day | ORAL | 3 refills | Status: AC

## 2024-02-07 MED ORDER — SERTRALINE HCL 25 MG PO TABS: 25.0000 mg | ORAL_TABLET | Freq: Every day | ORAL | 3 refills | Status: AC

## 2024-02-07 NOTE — Progress Notes (Signed)
 OFFICE VISIT  02/07/2024  CC:  Chief Complaint  Patient presents with   Follow-up    Low back pain; was taking Methocarbamol  prescribed by another provider last week and had improvement; believes he may have caused more mild pain    Patient is a 54 y.o. male who presents for 2-week follow-up low back pain. Dr. Sherlynn saw him on 01/24/2024 for acute low back pain without sciatica.  Was instructed to continue muscle relaxant and NSAIDs.  His anxiety was exacerbated by his low back pain and his Zoloft  was increased to 75 mg a day, which Dr.Veludandi had started 12/29/2023 at 50 mg a day dosing.  He was referred to psychology at that time as well).  INTERIM HX: Back pain still a problem, waxing and waning.  Points to the midline at about L4-L5 level.  Getting up and stretching does help some as does periodic use of ibuprofen and Robaxin . No radiation into the buttocks or down the legs.  No paresthesias or leg weakness. He is worried about his elevated PSA.  He expresses concern that possibly is back pain is a signal of metastatic prostate cancer.  He understands that this is unlikely but it still has been worried.  PSA was mildly elevated back in September 2025.  He saw the urologist and repeat PSA x 2 have been less than 5. Prostate MRI 10/22/2023 IMPRESSION: Bilateral peripheral zone multiparametric signal abnormalities which are most consistent with prostatitis and considered PI-RADS(v2.1)-2.  His urologist recommended surveillance PSA in 6 to 12 months. However, patient states he was not initially comfortable with this approach and they were going to move forward with the biopsy in the next couple of weeks.  He is reconsidering this, though.  On a good note he says that the recent increase in Zoloft  from 50 to 75 mg a day has significantly helped his chronic anxiety.  ROS as above, plus--> no abnormal weight loss, no fevers, no CP, no SOB, no wheezing, no cough, no dizziness, no HAs,  no rashes, no melena/hematochezia.  No polyuria or polydipsia.  No myalgias or arthralgias.  No focal weakness, paresthesias, or tremors.  No acute vision or hearing abnormalities.  No dysuria or unusual/new urinary urgency or frequency.  No recent changes in lower legs. No n/v/d or abd pain.  No palpitations.    Past Medical History:  Diagnosis Date   Elevated PSA    09/2023, mild elev but increased PSA velocity as well--> urology referral->prostate MRI 10/2023 c/w prostatitis.   Erectile dysfunction    Genital warts    x 1 episode in college--treated and have never recurred.   History of migraine headaches    about 1/month--advil migraine helps abort them   History of rectal bleeding    Rare BRB on toilet tissue; usually after a hard stool.  Only occaasionally    Past Surgical History:  Procedure Laterality Date   APPENDECTOMY  1986   CHEST TUBE INSERTION  1989   spontaneous pneumothorax   COLONOSCOPY     01/2021 NORMAL. recall 2033   INGUINAL HERNIA REPAIR Left 03/2016   with mesh.   TONSILLECTOMY  1970s   WISDOM TOOTH EXTRACTION      Outpatient Medications Prior to Visit  Medication Sig Dispense Refill   tadalafil  (CIALIS ) 5 MG tablet 1-2 tabs po every day prn ED 20 tablet 5   tamsulosin (FLOMAX) 0.4 MG CAPS capsule Take 0.4 mg by mouth at bedtime.     methocarbamol  (ROBAXIN ) 750  MG tablet Take by mouth.     sertraline  (ZOLOFT ) 25 MG tablet Take 1 tablet (25 mg total) by mouth daily. 90 tablet 0   sertraline  (ZOLOFT ) 50 MG tablet Take 1 tablet (50 mg total) by mouth daily. 90 tablet 0   No facility-administered medications prior to visit.    Allergies[1]  Review of Systems As per HPI  PE:    02/07/2024    1:38 PM 01/24/2024    1:03 PM 12/29/2023    2:48 PM  Vitals with BMI  Height 6' 2    Weight 194 lbs 10 oz 192 lbs 6 oz 187 lbs  BMI 24.97    Systolic 121 121 884  Diastolic 78 85 80  Pulse 89 103 98     Physical Exam  General: Alert and  well-appearing. Affect is anxious and pleasant. Back: No tenderness to palpation.  He has full range of motion.  Leg strength 5 out of 5 proximally and distally bilaterally.  LABS:  Last metabolic panel Lab Results  Component Value Date   GLUCOSE 81 09/21/2023   NA 138 09/21/2023   K 4.2 09/21/2023   CL 104 09/21/2023   CO2 25 09/21/2023   BUN 16 09/21/2023   CREATININE 1.05 09/21/2023   GFR 80.82 09/21/2023   CALCIUM 9.9 09/21/2023   PROT 7.7 09/21/2023   ALBUMIN 4.7 09/21/2023   BILITOT 1.2 09/21/2023   ALKPHOS 57 09/21/2023   AST 15 09/21/2023   ALT 12 09/21/2023   IMPRESSION AND PLAN:  #1 acute midline low back pain, suspect musculoskeletal etiology. No preceding overuse/strain injury though. Will order lumbar spine radiographs. He will continue home rehab exercises. Refilled Robaxin  750 mg to take up to 3 times a day as needed.  Okay to take ibuprofen 400 to 600 mg 1-2 times a day as needed.  2.  GAD with recent adjustment disorder with mixed anxious and depressed mood. He is doing well now on 75 mg sertraline  daily.  Refills of the 50 mg and 25 mg capsules x 90 days ordered today.  #3 elevated PSA. Urologist workup has been reassuring. He is leaning towards a repeat PSA as his next step but may also possibly choose to do prostate biopsy.  An After Visit Summary was printed and given to the patient.  FOLLOW UP: Return for If not significantly improving over the next 1 month.  Signed:  Gerlene Hockey, MD           02/07/2024     [1]  Allergies Allergen Reactions   Aspirin     Pt had a reaction as a child, therefore he decided to never take it

## 2024-02-09 ENCOUNTER — Ambulatory Visit (HOSPITAL_BASED_OUTPATIENT_CLINIC_OR_DEPARTMENT_OTHER)
Admission: RE | Admit: 2024-02-09 | Discharge: 2024-02-09 | Disposition: A | Source: Ambulatory Visit | Attending: Family Medicine | Admitting: Family Medicine

## 2024-02-09 DIAGNOSIS — M545 Low back pain, unspecified: Secondary | ICD-10-CM | POA: Diagnosis present

## 2024-02-12 ENCOUNTER — Ambulatory Visit: Payer: Self-pay | Admitting: Family Medicine

## 2024-02-12 NOTE — Addendum Note (Signed)
 Addended by: SHARRON WYATT SQUIBB on: 02/12/2024 11:11 AM   Modules accepted: Level of Service

## 2024-02-20 ENCOUNTER — Ambulatory Visit: Admitting: Psychology

## 2024-09-24 ENCOUNTER — Encounter: Admitting: Family Medicine
